# Patient Record
Sex: Male | Born: 1965 | Race: White | Hispanic: No | Marital: Married | Smoking: Current every day smoker
Health system: Southern US, Community
[De-identification: ages and names within clinical notes are randomized; demographics above are authoritative.]

## PROBLEM LIST (undated history)

## (undated) DIAGNOSIS — K852 Alcohol induced acute pancreatitis without necrosis or infection: Secondary | ICD-10-CM

## (undated) DIAGNOSIS — J449 Chronic obstructive pulmonary disease, unspecified: Secondary | ICD-10-CM

## (undated) DIAGNOSIS — J40 Bronchitis, not specified as acute or chronic: Secondary | ICD-10-CM

## (undated) DIAGNOSIS — I2699 Other pulmonary embolism without acute cor pulmonale: Secondary | ICD-10-CM

## (undated) DIAGNOSIS — K760 Fatty (change of) liver, not elsewhere classified: Secondary | ICD-10-CM

## (undated) DIAGNOSIS — J189 Pneumonia, unspecified organism: Secondary | ICD-10-CM

## (undated) DIAGNOSIS — R0602 Shortness of breath: Secondary | ICD-10-CM

## (undated) HISTORY — PX: CHOLECYSTECTOMY: SHX55

---
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
## 2002-03-02 HISTORY — PX: ORIF MANDIBULAR FRACTURE: SHX2127

## 2002-08-21 ENCOUNTER — Encounter: Payer: Self-pay | Admitting: Emergency Medicine

## 2002-08-21 ENCOUNTER — Encounter: Payer: Self-pay | Admitting: Oral and Maxillofacial Surgery

## 2002-08-22 ENCOUNTER — Observation Stay (HOSPITAL_COMMUNITY): Admission: AD | Admit: 2002-08-22 | Discharge: 2002-08-22 | Payer: Self-pay | Admitting: Emergency Medicine

## 2007-11-18 ENCOUNTER — Inpatient Hospital Stay (HOSPITAL_COMMUNITY): Admission: EM | Admit: 2007-11-18 | Discharge: 2007-11-23 | Payer: Self-pay | Admitting: Emergency Medicine
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
## 2010-07-15 NOTE — Group Therapy Note (Signed)
Stanley Thompson, Stanley Thompson            ACCOUNT NO.:  [id number]   MEDICAL RECORD NO.:  [id number]          PATIENT TYPE:  OBV   LOCATION:  A330                          FACILITY:  [facility]   PHYSICIAN:  Edward L. Juanetta Gosling, M.D.DATE OF BIRTH:  September 20, 1965   DATE OF PROCEDURE:  DATE OF DISCHARGE:                                 PROGRESS NOTE   SUBJECTIVE:  Stanley Thompson seems a good bit better.  He has no new  complaints and no new problems noted.  He says he is still coughing and  congested.  His temperature is 98.3, pulse 97, respirations 20, blood  pressure 128/74.  His blood gas on 2 liters shows his pO2 is only 52,  pCO2 of 42, pH 7.40.  His white count is 14,000, hemoglobin is 13.5,  blood sugar is 149.  He is coughing up some purulent material but no  material that has black in it, so I do not think he is having any  thermal injury to the actual airway, but he had certainly set off his  asthma.   PLAN:  Is for him to continue on his treatments and medications.  No  changes today.  We will see how he does.      Edward L. Juanetta Gosling, M.D.  Electronically Signed     ELH/MEDQ  D:  11/17/2007  T:  11/17/2007  Job:  [id number]
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
## 2010-07-15 NOTE — Group Therapy Note (Signed)
NAMEWALTER, GRIMA            ACCOUNT NO.:  [id number]   MEDICAL RECORD NO.:  [id number]          PATIENT TYPE:  INP   LOCATION:  A330                          [facility]ACILITY:  [facility]   PHYSICIAN:  Edward L. Juanetta Gosling, M.D.DATE OF BIRTH:  1966-02-25   DATE OF PROCEDURE:  11/20/2007  DATE OF DISCHARGE:                                 PROGRESS NOTE   Mr. Maahs is improving, seems to be overall better.  He has no new  complaints.  His chest is clearing, but he still has wheezes.  His heart  is regular.  His abdomen is soft.  Extremities showed no edema.   Assessment is that he is improving.  He has orders to change his  medications to p.o. which I think is appropriate and I will plan to  follow.      Edward L. Juanetta Gosling, M.D.  Electronically Signed     ELH/MEDQ  D:  11/20/2007  T:  11/21/2007  Job:  [id number]

## 2010-07-15 NOTE — Group Therapy Note (Signed)
Stanley Thompson, Stanley Thompson            ACCOUNT NO.:  [id number]   MEDICAL RECORD NO.:  [id number]          PATIENT TYPE:  INP   LOCATION:  A330                          [facility]ILITY[facility]   PHYSICIAN:  Edward L. Juanetta Gosling, M.D.DATE OF BIRTH:  03/24/1965   DATE OF PROCEDURE:  DATE OF DISCHARGE:                                 PROGRESS NOTE   Mr. Burtch states he was feeling much better yesterday, but this  morning he is more wheezing, more congested.  He is having some left-  sided chest discomfort which is pleuritic.  His physical exam shows that  he does not look as comfortable.  His temperature 98.2, pulse is 83,  respirations 20, blood pressure 154/81, O2 sats 94% on 3 liters.  His  heart is regular.  CHEST:  Much more wheezing, etc.   ASSESSMENT:  I think, he is a little worse.  We did switch him to oral  medications yesterday and my plan then is to get a chest x-ray and then  decide whether to put him back on IV or not.      Edward L. Juanetta Gosling, M.D.  Electronically Signed     ELH/MEDQ  D:  11/21/2007  T:  11/21/2007  Job:  [id number]
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
## 2010-11-06 ENCOUNTER — Emergency Department (HOSPITAL_COMMUNITY)
Admission: EM | Admit: 2010-11-06 | Discharge: 2010-11-06 | Disposition: A | Discharge status: Home / Self Care | Payer: Self-pay | Attending: Emergency Medicine | Admitting: Emergency Medicine

## 2010-11-06 DIAGNOSIS — R1110 Vomiting, unspecified: Secondary | ICD-10-CM | POA: Insufficient documentation

## 2010-11-06 DIAGNOSIS — R112 Nausea with vomiting, unspecified: Secondary | ICD-10-CM

## 2010-11-06 DIAGNOSIS — J45909 Unspecified asthma, uncomplicated: Secondary | ICD-10-CM | POA: Insufficient documentation

## 2010-11-06 DIAGNOSIS — K2990 Gastroduodenitis, unspecified, without bleeding: Secondary | ICD-10-CM

## 2010-11-06 DIAGNOSIS — F17200 Nicotine dependence, unspecified, uncomplicated: Secondary | ICD-10-CM | POA: Insufficient documentation

## 2010-11-06 DIAGNOSIS — K2970 Gastritis, unspecified, without bleeding: Secondary | ICD-10-CM

## 2010-11-06 HISTORY — DX: Pneumonia, unspecified organism: J18.9

## 2010-11-06 LAB — HEPATIC FUNCTION PANEL
Alkaline Phosphatase: 80 U/L (ref 39–117)
Bilirubin, Direct: 0.2 mg/dL (ref 0.0–0.3)
Indirect Bilirubin: 0.8 mg/dL (ref 0.3–0.9)
Total Bilirubin: 1 mg/dL (ref 0.3–1.2)

## 2010-11-06 LAB — URINE MICROSCOPIC-ADD ON

## 2010-11-06 LAB — BASIC METABOLIC PANEL
CO2: 30 mEq/L (ref 19–32)
Calcium: 9.2 mg/dL (ref 8.4–10.5)
Creatinine, Ser: 0.75 mg/dL (ref 0.50–1.35)
Glucose, Bld: 92 mg/dL (ref 70–99)

## 2010-11-06 LAB — CBC
HCT: 46.9 % (ref 39.0–52.0)
MCHC: 33.3 g/dL (ref 30.0–36.0)
MCV: 95.9 fL (ref 78.0–100.0)
RDW: 14.4 % (ref 11.5–15.5)
WBC: 7.3 10*3/uL (ref 4.0–10.5)

## 2010-11-06 LAB — DIFFERENTIAL
Basophils Absolute: 0 10*3/uL (ref 0.0–0.1)
Eosinophils Relative: 2 % (ref 0–5)
Lymphocytes Relative: 23 % (ref 12–46)
Monocytes Absolute: 0.5 10*3/uL (ref 0.1–1.0)

## 2010-11-06 LAB — URINALYSIS, ROUTINE W REFLEX MICROSCOPIC
Glucose, UA: NEGATIVE mg/dL
Hgb urine dipstick: NEGATIVE
Leukocytes, UA: NEGATIVE
Nitrite: NEGATIVE
Urobilinogen, UA: 1 mg/dL (ref 0.0–1.0)

## 2010-11-06 MED ORDER — SODIUM CHLORIDE 0.9 % IV BOLUS (SEPSIS)
1000.0000 mL | Freq: Once | INTRAVENOUS | Status: AC
  Administered 2010-11-06: 1000 mL via INTRAVENOUS

## 2010-11-06 MED ORDER — ONDANSETRON HCL 4 MG/2ML IJ SOLN
4.0000 mg | Freq: Once | INTRAMUSCULAR | Status: AC
  Administered 2010-11-06: 4 mg via INTRAVENOUS
  Filled 2010-11-06: qty 2

## 2010-11-06 MED ORDER — METOCLOPRAMIDE HCL 10 MG PO TABS: 10.0000 mg | ORAL_TABLET | Freq: Four times a day (QID) | ORAL | Status: AC | PRN

## 2010-11-06 MED ORDER — SODIUM CHLORIDE 0.9 % IV SOLN
Freq: Once | INTRAVENOUS | Status: AC
  Administered 2010-11-06: 14:00:00 via INTRAVENOUS

## 2010-11-06 NOTE — ED Provider Notes (Signed)
History     CSN: [id number] Arrival date & time: 11/06/2010 10:55 AM  Chief Complaint  Patient presents with  . Emesis   Patient is a 45 y.o. male presenting with vomiting. The history is provided by the patient.  Emesis  This is a new problem. The current episode started 2 days ago. The problem occurs 5 to 10 times per day. The problem has not changed since onset.The emesis has an appearance of stomach contents. There has been no fever. Pertinent negatives include no chills, no diarrhea, no fever and no sweats. Associated symptoms comments: There is abdominal soreness related to vomiting, but no true abdominal pain..  Symptoms are severe - he has not been able to hold anything down for two days. He tried taking Pepto Bismol with no relief. Nothing makes symptoms better, nothing makes it worse.  Past Medical History  Diagnosis Date  . Asthma   . Pneumonia     History reviewed. No pertinent past surgical history.  No family history on file.  History  Substance Use Topics  . Smoking status: Current Everyday Smoker  . Smokeless tobacco: Not on file  . Alcohol Use: Yes     occasionally      Review of Systems  Constitutional: Negative for fever and chills.  Gastrointestinal: Positive for vomiting. Negative for diarrhea.  All other systems reviewed and are negative.    Physical Exam  BP 153/88  Pulse 68  Temp(Src) 98.1 F (36.7 C) (Oral)  Resp 16  Ht 6\' 4"  (1.93 m)  Wt 270 lb (122.471 kg)  BMI 32.87 kg/m2  SpO2 96%  Physical Exam  Constitutional: He is oriented to person, place, and time. He appears well-developed and well-nourished. No distress.  HENT:  Head: Normocephalic and atraumatic.  Right Ear: External ear normal.  Left Ear: External ear normal.  Mouth/Throat: Oropharynx is clear and moist.  Eyes: Conjunctivae are normal. Pupils are equal, round, and reactive to light. No scleral icterus.  Neck: Normal range of motion. Neck supple. No JVD present.    Cardiovascular: Normal rate, regular rhythm and intact distal pulses.   No murmur heard. Pulmonary/Chest: Effort normal and breath sounds normal. He has no wheezes. He has no rales.  Abdominal: Soft. Bowel sounds are normal. He exhibits no mass. There is no tenderness.  Musculoskeletal: Normal range of motion. He exhibits no edema and no tenderness.  Lymphadenopathy:    He has no cervical adenopathy.  Neurological: He is alert and oriented to person, place, and time. No cranial nerve deficit. Coordination normal.  Skin: Skin is warm and dry. No rash noted.  Psychiatric: He has a normal mood and affect. His behavior is normal. Thought content normal.    ED Course  Procedures He was given IV fluids and IV Zofran. He is feeling much better now, and hass tolerated oral fluids. MDM Probable viral gastritis. Results for orders placed during the hospital encounter of 11/06/10  CBC      Component Value Range   WBC 7.3  4.0 - 10.5 (K/uL)   RBC 4.89  4.22 - 5.81 (MIL/uL)   Hemoglobin 15.6  13.0 - 17.0 (g/dL)   HCT 40.9  81.1 - 91.4 (%)   MCV 95.9  78.0 - 100.0 (fL)   MCH 31.9  26.0 - 34.0 (pg)   MCHC 33.3  30.0 - 36.0 (g/dL)   RDW 78.2  95.6 - 21.3 (%)   Platelets 147 (*) 150 - 400 (K/uL)  DIFFERENTIAL  Component Value Range   Neutrophils Relative 69  43 - 77 (%)   Neutro Abs 5.1  1.7 - 7.7 (K/uL)   Lymphocytes Relative 23  12 - 46 (%)   Lymphs Abs 1.7  0.7 - 4.0 (K/uL)   Monocytes Relative 7  3 - 12 (%)   Monocytes Absolute 0.5  0.1 - 1.0 (K/uL)   Eosinophils Relative 2  0 - 5 (%)   Eosinophils Absolute 0.1  0.0 - 0.7 (K/uL)   Basophils Relative 0  0 - 1 (%)   Basophils Absolute 0.0  0.0 - 0.1 (K/uL)  BASIC METABOLIC PANEL      Component Value Range   Sodium 139  135 - 145 (mEq/L)   Potassium 3.5  3.5 - 5.1 (mEq/L)   Chloride 95 (*) 96 - 112 (mEq/L)   CO2 30  19 - 32 (mEq/L)   Glucose, Bld 92  70 - 99 (mg/dL)   BUN 14  6 - 23 (mg/dL)   Creatinine, Ser 9.62  0.50 - 1.35  (mg/dL)   Calcium 9.2  8.4 - 95.2 (mg/dL)   GFR calc non Af Amer >60  >60 (mL/min)   GFR calc Af Amer >60  >60 (mL/min)  URINALYSIS, ROUTINE W REFLEX MICROSCOPIC      Component Value Range   Color, Urine YELLOW  YELLOW    Appearance CLEAR  CLEAR    Specific Gravity, Urine 1.020  1.005 - 1.030    pH 7.5  5.0 - 8.0    Glucose, UA NEGATIVE  NEGATIVE (mg/dL)   Hgb urine dipstick NEGATIVE  NEGATIVE    Bilirubin Urine MODERATE (*) NEGATIVE    Ketones, ur >80 (*) NEGATIVE (mg/dL)   Protein, ur TRACE (*) NEGATIVE (mg/dL)   Urobilinogen, UA 1.0  0.0 - 1.0 (mg/dL)   Nitrite NEGATIVE  NEGATIVE    Leukocytes, UA NEGATIVE  NEGATIVE   URINE MICROSCOPIC-ADD ON      Component Value Range   WBC, UA 0-2  <3 (WBC/hpf)   RBC / HPF 0-2  <3 (RBC/hpf)   Urine-Other MUCOUS PRESENT    LIPASE, BLOOD      Component Value Range   Lipase 54  11 - 59 (U/L)  HEPATIC FUNCTION PANEL      Component Value Range   Total Protein 6.8  6.0 - 8.3 (g/dL)   Albumin 4.0  3.5 - 5.2 (g/dL)   AST 41 (*) 0 - 37 (U/L)   ALT 33  0 - 53 (U/L)   Alkaline Phosphatase 80  39 - 117 (U/L)   Total Bilirubin 1.0  0.3 - 1.2 (mg/dL)   Bilirubin, Direct 0.2  0.0 - 0.3 (mg/dL)   Indirect Bilirubin 0.8  0.3 - 0.9 (mg/dL)   No results found.        Dione Booze, MD 11/06/10 1505

## 2010-11-06 NOTE — ED Notes (Signed)
Ice chips provided to pt.

## 2010-11-06 NOTE — ED Notes (Signed)
Pt medicated for nausea. NAD. VSS.

## 2010-11-06 NOTE — ED Notes (Signed)
Pt given wet swab for mouth. NAD at this time.

## 2010-11-06 NOTE — ED Notes (Signed)
Pt c/o vomiting, headache, and fever since Tuesday.  Denies diarrhea.  LBM  Was this am but was small.  C/O upper abd pain

## 2010-11-06 NOTE — ED Notes (Signed)
Pt has been able to keep ice chips down. NAD. No active vomiting since arrival. Family at bedside.

## 2010-12-01 LAB — DIFFERENTIAL
Basophils Absolute: 0
Basophils Absolute: 0
Basophils Absolute: 0
Basophils Absolute: 0
Basophils Absolute: 0
Basophils Relative: 0
Basophils Relative: 0
Basophils Relative: 0
Basophils Relative: 0
Eosinophils Absolute: 0
Eosinophils Absolute: 0
Eosinophils Absolute: 0
Eosinophils Absolute: 0
Eosinophils Absolute: 0
Eosinophils Relative: 0
Eosinophils Relative: 0
Eosinophils Relative: 0
Eosinophils Relative: 0
Eosinophils Relative: 0
Lymphocytes Relative: 12
Lymphocytes Relative: 7 — ABNORMAL LOW
Lymphocytes Relative: 7 — ABNORMAL LOW
Lymphs Abs: 0.6 — ABNORMAL LOW
Lymphs Abs: 0.6 — ABNORMAL LOW
Lymphs Abs: 0.9
Lymphs Abs: 0.9
Monocytes Absolute: 0.2
Monocytes Absolute: 0.2
Monocytes Absolute: 0.3
Monocytes Relative: 1 — ABNORMAL LOW
Monocytes Relative: 2 — ABNORMAL LOW
Monocytes Relative: 2 — ABNORMAL LOW
Monocytes Relative: 6
Neutro Abs: 10.6 — ABNORMAL HIGH
Neutro Abs: 11.3 — ABNORMAL HIGH
Neutro Abs: 7.1
Neutrophils Relative %: 85 — ABNORMAL HIGH
Neutrophils Relative %: 89 — ABNORMAL HIGH
Neutrophils Relative %: 91 — ABNORMAL HIGH
Neutrophils Relative %: 92 — ABNORMAL HIGH

## 2010-12-01 LAB — BASIC METABOLIC PANEL
BUN: 12
BUN: 14
BUN: 16
CO2: 24
CO2: 30
CO2: 30
Calcium: 8.3 — ABNORMAL LOW
Calcium: 8.8
Calcium: 8.9
Chloride: 106
Creatinine, Ser: 0.85
Creatinine, Ser: 0.86
Creatinine, Ser: 0.88
Creatinine, Ser: 0.97
GFR calc Af Amer: >60
GFR calc Af Amer: >60
GFR calc Af Amer: >60
GFR calc Af Amer: >60
GFR calc non Af Amer: >60
GFR calc non Af Amer: >60
GFR calc non Af Amer: >60
Glucose, Bld: 127 — ABNORMAL HIGH
Glucose, Bld: 156 — ABNORMAL HIGH
Potassium: 3.6
Potassium: 4.5
Sodium: 136
Sodium: 142

## 2010-12-01 LAB — CBC
HCT: 39.7
HCT: 42.2
HCT: 42.5
HCT: 42.9
Hemoglobin: 14.1
Hemoglobin: 14.3
Hemoglobin: 14.4
Hemoglobin: 15.3
MCHC: 34.1
MCHC: 34.3
MCHC: 34.5
MCV: 89.1
MCV: 89.6
MCV: 89.7
MCV: 89.8
Platelets: 150
Platelets: 168
Platelets: 208
Platelets: 225
RBC: 4.4
RBC: 4.55
RBC: 4.72
RBC: 4.76
RBC: 5.01
RDW: 12.6
RDW: 12.8
WBC: 10.6 — ABNORMAL HIGH
WBC: 13.2 — ABNORMAL HIGH
WBC: 14.6 — ABNORMAL HIGH
WBC: 7.3
WBC: 9.4

## 2010-12-01 LAB — BLOOD GAS, ARTERIAL
Acid-Base Excess: 0.7
Bicarbonate: 26.2 — ABNORMAL HIGH
Bicarbonate: 27.3 — ABNORMAL HIGH
FIO2: 0.21
O2 Content: 3
O2 Saturation: 85
O2 Saturation: 87.7
O2 Saturation: 93.3
Patient temperature: 37
Patient temperature: 37
Patient temperature: 37
TCO2: 23.3
pH, Arterial: 7.409
pH, Arterial: 7.454 — ABNORMAL HIGH

## 2010-12-01 LAB — COMPREHENSIVE METABOLIC PANEL
ALT: 13
AST: 16
CO2: 27
Chloride: 108
Creatinine, Ser: 0.85
GFR calc Af Amer: >60
GFR calc non Af Amer: >60
Glucose, Bld: 170 — ABNORMAL HIGH
Sodium: 142
Total Bilirubin: 0.5

## 2011-04-01 ENCOUNTER — Emergency Department (HOSPITAL_COMMUNITY)
Admission: EM | Admit: 2011-04-01 | Discharge: 2011-04-01 | Disposition: A | Discharge status: Home / Self Care | Payer: Self-pay | Attending: Emergency Medicine | Admitting: Emergency Medicine

## 2011-04-01 ENCOUNTER — Encounter (HOSPITAL_COMMUNITY): Payer: Self-pay | Admitting: *Deleted

## 2011-04-01 DIAGNOSIS — Z8701 Personal history of pneumonia (recurrent): Secondary | ICD-10-CM | POA: Insufficient documentation

## 2011-04-01 DIAGNOSIS — J45909 Unspecified asthma, uncomplicated: Secondary | ICD-10-CM | POA: Insufficient documentation

## 2011-04-01 DIAGNOSIS — R220 Localized swelling, mass and lump, head: Secondary | ICD-10-CM | POA: Insufficient documentation

## 2011-04-01 DIAGNOSIS — R609 Edema, unspecified: Secondary | ICD-10-CM | POA: Insufficient documentation

## 2011-04-01 DIAGNOSIS — R252 Cramp and spasm: Secondary | ICD-10-CM | POA: Insufficient documentation

## 2011-04-01 DIAGNOSIS — R112 Nausea with vomiting, unspecified: Secondary | ICD-10-CM | POA: Insufficient documentation

## 2011-04-01 DIAGNOSIS — J3489 Other specified disorders of nose and nasal sinuses: Secondary | ICD-10-CM | POA: Insufficient documentation

## 2011-04-01 DIAGNOSIS — Z7982 Long term (current) use of aspirin: Secondary | ICD-10-CM | POA: Insufficient documentation

## 2011-04-01 DIAGNOSIS — K529 Noninfective gastroenteritis and colitis, unspecified: Secondary | ICD-10-CM

## 2011-04-01 DIAGNOSIS — R221 Localized swelling, mass and lump, neck: Secondary | ICD-10-CM | POA: Insufficient documentation

## 2011-04-01 DIAGNOSIS — R197 Diarrhea, unspecified: Secondary | ICD-10-CM | POA: Insufficient documentation

## 2011-04-01 HISTORY — DX: Bronchitis, not specified as acute or chronic: J40

## 2011-04-01 LAB — CBC
HCT: 39.6 % (ref 39.0–52.0)
MCV: 98.3 fL (ref 78.0–100.0)
Platelets: 126 10*3/uL — ABNORMAL LOW (ref 150–400)
RBC: 4.03 MIL/uL — ABNORMAL LOW (ref 4.22–5.81)
RDW: 14.8 % (ref 11.5–15.5)
WBC: 4.1 10*3/uL (ref 4.0–10.5)

## 2011-04-01 LAB — DIFFERENTIAL
Basophils Absolute: 0 10*3/uL (ref 0.0–0.1)
Lymphocytes Relative: 31 % (ref 12–46)
Lymphs Abs: 1.3 10*3/uL (ref 0.7–4.0)
Neutro Abs: 2.3 10*3/uL (ref 1.7–7.7)

## 2011-04-01 LAB — URINALYSIS, ROUTINE W REFLEX MICROSCOPIC
Glucose, UA: NEGATIVE mg/dL
Hgb urine dipstick: NEGATIVE
Leukocytes, UA: NEGATIVE
Specific Gravity, Urine: 1.01 (ref 1.005–1.030)
pH: >9 — ABNORMAL HIGH (ref 5.0–8.0)

## 2011-04-01 LAB — BASIC METABOLIC PANEL
CO2: 28 mEq/L (ref 19–32)
Chloride: 95 mEq/L — ABNORMAL LOW (ref 96–112)
Glucose, Bld: 103 mg/dL — ABNORMAL HIGH (ref 70–99)
Sodium: 135 mEq/L (ref 135–145)

## 2011-04-01 LAB — URINE MICROSCOPIC-ADD ON

## 2011-04-01 MED ORDER — ONDANSETRON HCL 8 MG PO TABS: 8.0000 mg | ORAL_TABLET | ORAL | Status: AC | PRN

## 2011-04-01 MED ORDER — AMOXICILLIN-POT CLAVULANATE 875-125 MG PO TABS: 1.0000 | ORAL_TABLET | Freq: Two times a day (BID) | ORAL | Status: AC

## 2011-04-01 MED ORDER — SODIUM CHLORIDE 0.9 % IV BOLUS (SEPSIS)
1000.0000 mL | Freq: Once | INTRAVENOUS | Status: AC
  Administered 2011-04-01: 1000 mL via INTRAVENOUS

## 2011-04-01 MED ORDER — ONDANSETRON HCL 4 MG/2ML IJ SOLN
4.0000 mg | Freq: Once | INTRAMUSCULAR | Status: AC
  Administered 2011-04-01: 4 mg via INTRAVENOUS
  Filled 2011-04-01: qty 2

## 2011-04-01 MED ORDER — DIPHENOXYLATE-ATROPINE 2.5-0.025 MG PO TABS: 1.0000 | ORAL_TABLET | Freq: Four times a day (QID) | ORAL | Status: AC | PRN

## 2011-04-01 MED ORDER — PANTOPRAZOLE SODIUM 40 MG IV SOLR
40.0000 mg | Freq: Once | INTRAVENOUS | Status: AC
  Administered 2011-04-01: 40 mg via INTRAVENOUS
  Filled 2011-04-01: qty 40

## 2011-04-01 NOTE — ED Notes (Signed)
PT c/o n/v/d x 3 days.  Reports swelling under eyes and cramping in both legs.  Denies abd pain but says abd feels sore from vomiting.

## 2011-04-01 NOTE — ED Provider Notes (Signed)
History   This chart was scribed for Stanley Hutching, MD by Melba Coon. The patient was seen in room APA07/APA07 and the patient's care was started at 3:28PM.    CSN: [id number]  Arrival date & time 04/01/11  1415   First MD Initiated Contact with Patient 04/01/11 1509      Chief Complaint  Patient presents with  . Emesis    (Consider location/radiation/quality/duration/timing/severity/associated sxs/prior treatment) HPI Stanley Thompson is a 46 y.o. male who presents to the Emergency Department complaining of persistent, moderate to severe n/v/d with an onset 3 days ago. Pt describes the diarrhea as black and watery and states the vomit has a yellow color. Pt has also been experiencing nasal congestion, edema around the eyes and upper cheeks, rhinorrhea, cramps and aches in calves. Pt has a Hx of bronchitis and asthma. No CP, no neck pain, no abd pain.  PCP: none      Past Medical History  Diagnosis Date  . Asthma   . Pneumonia   . Bronchitis     Past Surgical History  Procedure Date  . Back surgery     Family History  Problem Relation Age of Onset  . Hypertension Mother   . Cancer Mother   . Hypertension Father   . Heart failure Father   . Cancer Father     History  Substance Use Topics  . Smoking status: Current Everyday Smoker  . Smokeless tobacco: Not on file  . Alcohol Use: Yes     occasionally      Review of Systems 10 Systems reviewed and are negative for acute change except as noted in the HPI.  Allergies  Advair diskus  Home Medications   Current Outpatient Rx  Name Route Sig Dispense Refill  . PROVENTIL HFA IN Inhalation Inhale 1 puff into the lungs every 4 (four) hours as needed. Shortness of Breath/Asthma Symptoms.    . ASPIRIN 81 MG PO CHEW Oral Chew 81 mg by mouth every 7 (seven) days.    Marland Kitchen ADVIL PM PO Oral Take 1 tablet by mouth at bedtime as needed. Sleep     . NAPROXEN SODIUM 220 MG PO TABS Oral Take 220 mg by mouth 2 (two)  times daily as needed. Headache     . TETRAHYDROZOLINE-ZN SULFATE 0.05-0.25 % OP SOLN Both Eyes Place 2 drops into both eyes 3 (three) times daily as needed. Allergies     . MELATIN PO Oral Take 1 tablet by mouth at bedtime as needed. Sleep       BP 154/97  Pulse 70  Temp(Src) 99.1 F (37.3 C) (Oral)  Resp 20  Ht 6\' 3"  (1.905 m)  Wt 255 lb (115.667 kg)  BMI 31.87 kg/m2  SpO2 92%  Physical Exam  Constitutional: He is oriented to person, place, and time. He appears well-developed and well-nourished.  HENT:  Head: Normocephalic and atraumatic.  Mouth/Throat: Mucous membranes are dry.       Maxillary sinus swelling   Eyes: Conjunctivae and EOM are normal. Pupils are equal, round, and reactive to light. No scleral icterus.  Neck: Normal range of motion. Neck supple. No thyromegaly present.  Cardiovascular: Normal rate, regular rhythm and normal heart sounds.  Exam reveals no gallop and no friction rub.   No murmur heard. Pulmonary/Chest: Effort normal and breath sounds normal. No stridor. He has no wheezes. He has no rales. He exhibits no tenderness.  Abdominal: Soft. Bowel sounds are normal. He exhibits no distension. There is  no tenderness. There is no rebound.  Musculoskeletal: Normal range of motion. He exhibits no edema.  Lymphadenopathy:    He has no cervical adenopathy.  Neurological: He is alert and oriented to person, place, and time. Coordination normal.  Skin: Skin is warm. No rash noted. No erythema.  Psychiatric: He has a normal mood and affect. His behavior is normal.    ED Course  Procedures (including critical care time)  DIAGNOSTIC STUDIES: Oxygen Saturation is 96% on room air, adequate by my interpretation.    COORDINATION OF CARE: 3:33PM - EDMD will give to pt IV fluids with Zofran and protonix to settle stomach; EDMD will also run blood work for pt   Results for orders placed during the hospital encounter of 04/01/11  CBC      Component Value Range    WBC 4.1  4.0 - 10.5 (K/uL)   RBC 4.03 (*) 4.22 - 5.81 (MIL/uL)   Hemoglobin 13.3  13.0 - 17.0 (g/dL)   HCT 16.1  09.6 - 04.5 (%)   MCV 98.3  78.0 - 100.0 (fL)   MCH 33.0  26.0 - 34.0 (pg)   MCHC 33.6  30.0 - 36.0 (g/dL)   RDW 40.9  81.1 - 91.4 (%)   Platelets 126 (*) 150 - 400 (K/uL)  DIFFERENTIAL      Component Value Range   Neutrophils Relative 55  43 - 77 (%)   Neutro Abs 2.3  1.7 - 7.7 (K/uL)   Lymphocytes Relative 31  12 - 46 (%)   Lymphs Abs 1.3  0.7 - 4.0 (K/uL)   Monocytes Relative 11  3 - 12 (%)   Monocytes Absolute 0.5  0.1 - 1.0 (K/uL)   Eosinophils Relative 2  0 - 5 (%)   Eosinophils Absolute 0.1  0.0 - 0.7 (K/uL)   Basophils Relative 1  0 - 1 (%)   Basophils Absolute 0.0  0.0 - 0.1 (K/uL)  BASIC METABOLIC PANEL      Component Value Range   Sodium 135  135 - 145 (mEq/L)   Potassium 3.7  3.5 - 5.1 (mEq/L)   Chloride 95 (*) 96 - 112 (mEq/L)   CO2 28  19 - 32 (mEq/L)   Glucose, Bld 103 (*) 70 - 99 (mg/dL)   BUN 10  6 - 23 (mg/dL)   Creatinine, Ser 7.82  0.50 - 1.35 (mg/dL)   Calcium 9.5  8.4 - 95.6 (mg/dL)   GFR calc non Af Amer >90  >90 (mL/min)   GFR calc Af Amer >90  >90 (mL/min)  URINALYSIS, ROUTINE W REFLEX MICROSCOPIC      Component Value Range   Color, Urine YELLOW  YELLOW    APPearance CLEAR  CLEAR    Specific Gravity, Urine 1.010  1.005 - 1.030    pH >9.0 (*) 5.0 - 8.0    Glucose, UA NEGATIVE  NEGATIVE (mg/dL)   Hgb urine dipstick NEGATIVE  NEGATIVE    Bilirubin Urine SMALL (*) NEGATIVE    Ketones, ur 40 (*) NEGATIVE (mg/dL)   Protein, ur 30 (*) NEGATIVE (mg/dL)   Urobilinogen, UA 1.0  0.0 - 1.0 (mg/dL)   Nitrite NEGATIVE  NEGATIVE    Leukocytes, UA NEGATIVE  NEGATIVE   URINE MICROSCOPIC-ADD ON      Component Value Range   Squamous Epithelial / LPF RARE  RARE    Bacteria, UA RARE  RARE    Casts HYALINE CASTS (*) NEGATIVE      No results found.  No diagnosis found.    MDM  History and physical consistent with gastroenteritis. Will  hydrate, IV Zofran, IV protonix Recheck at 1830:  Much better. Discharge home on Lomotil, Zofran, Augmentin for sinus infection  I personally performed the services described in this documentation, which was scribed in my presence. The recorded information has been reviewed and considered.        Stanley Hutching, MD 04/01/11 [phone]

## 2011-04-01 NOTE — ED Notes (Signed)
Vomiting , diarrhea, chills for 3 days, fever.sweats. Eyes feel "puffy"  Cough

## 2011-11-20 ENCOUNTER — Emergency Department (HOSPITAL_COMMUNITY): Payer: Self-pay

## 2011-11-20 ENCOUNTER — Encounter (HOSPITAL_COMMUNITY): Payer: Self-pay | Admitting: *Deleted

## 2011-11-20 ENCOUNTER — Emergency Department (HOSPITAL_COMMUNITY)
Admission: EM | Admit: 2011-11-20 | Discharge: 2011-11-20 | Disposition: A | Discharge status: Home / Self Care | Payer: Self-pay | Attending: Emergency Medicine | Admitting: Emergency Medicine

## 2011-11-20 DIAGNOSIS — Z7982 Long term (current) use of aspirin: Secondary | ICD-10-CM | POA: Insufficient documentation

## 2011-11-20 DIAGNOSIS — J9801 Acute bronchospasm: Secondary | ICD-10-CM

## 2011-11-20 DIAGNOSIS — J45909 Unspecified asthma, uncomplicated: Secondary | ICD-10-CM | POA: Insufficient documentation

## 2011-11-20 DIAGNOSIS — R51 Headache: Secondary | ICD-10-CM | POA: Insufficient documentation

## 2011-11-20 MED ORDER — SODIUM CHLORIDE 0.9 % IV SOLN
Freq: Once | INTRAVENOUS | Status: AC
  Administered 2011-11-20: 20 mL/h via INTRAVENOUS

## 2011-11-20 MED ORDER — PREDNISONE 20 MG PO TABS
60.0000 mg | ORAL_TABLET | Freq: Once | ORAL | Status: AC
  Administered 2011-11-20: 60 mg via ORAL
  Filled 2011-11-20: qty 3

## 2011-11-20 MED ORDER — TRAMADOL HCL 50 MG PO TABS: 50.0000 mg | ORAL_TABLET | Freq: Four times a day (QID) | ORAL | Status: DC | PRN

## 2011-11-20 MED ORDER — IPRATROPIUM BROMIDE 0.02 % IN SOLN
0.5000 mg | Freq: Once | RESPIRATORY_TRACT | Status: AC
  Administered 2011-11-20: 0.5 mg via RESPIRATORY_TRACT
  Filled 2011-11-20: qty 2.5

## 2011-11-20 MED ORDER — ALBUTEROL SULFATE (5 MG/ML) 0.5% IN NEBU
5.0000 mg | INHALATION_SOLUTION | Freq: Once | RESPIRATORY_TRACT | Status: AC
  Administered 2011-11-20: 5 mg via RESPIRATORY_TRACT
  Filled 2011-11-20: qty 1

## 2011-11-20 MED ORDER — METOCLOPRAMIDE HCL 5 MG/ML IJ SOLN
10.0000 mg | Freq: Once | INTRAMUSCULAR | Status: AC
  Administered 2011-11-20: 10 mg via INTRAVENOUS
  Filled 2011-11-20: qty 2

## 2011-11-20 MED ORDER — DIPHENHYDRAMINE HCL 12.5 MG/5ML PO ELIX
25.0000 mg | ORAL_SOLUTION | Freq: Once | ORAL | Status: DC
  Filled 2011-11-20: qty 10

## 2011-11-20 MED ORDER — ALBUTEROL SULFATE HFA 108 (90 BASE) MCG/ACT IN AERS
2.0000 | INHALATION_SPRAY | RESPIRATORY_TRACT | Status: DC | PRN
  Filled 2011-11-20: qty 6.7

## 2011-11-20 MED ORDER — DIPHENHYDRAMINE HCL 50 MG/ML IJ SOLN
25.0000 mg | Freq: Once | INTRAMUSCULAR | Status: AC
  Administered 2011-11-20: 25 mg via INTRAVENOUS
  Filled 2011-11-20: qty 1

## 2011-11-20 MED ORDER — KETOROLAC TROMETHAMINE 30 MG/ML IJ SOLN
30.0000 mg | Freq: Once | INTRAMUSCULAR | Status: AC
  Administered 2011-11-20: 30 mg via INTRAVENOUS
  Filled 2011-11-20: qty 1

## 2011-11-20 NOTE — ED Notes (Signed)
Pt presents with left sided headache x 2 weeks. Pt states was in medical induced coma, while intubated at [facility] with pneumonia over labor day weekend, has had headache since.  Pt also c/o chest congestion and nasal drainage. X 1 week. Denies fever,N/V, vision changes and neck pain. Neurologically intact, no deficits noted.

## 2011-11-20 NOTE — ED Provider Notes (Signed)
History   This chart was scribed for Benny Lennert, MD by Gerlean Ren. This patient was seen in room APA05/APA05 and the patient's care was started at 1:27PM.   CSN: [id number]  Arrival date & time 11/20/11  1258   First MD Initiated Contact with Patient 11/20/11 1320      Chief Complaint  Patient presents with  . Headache    (Consider location/radiation/quality/duration/timing/severity/associated sxs/prior treatment) Patient is a 46 y.o. male presenting with headaches. The history is provided by the patient. No language interpreter was used.  Headache  This is a new problem.   Stanley Thompson is a 46 y.o. male who presents to the Emergency Department complaining of a headache that has been constant since D/C from hospital 9/14 where he was in ICU for 8 days for "double pneumonia".  Pt denies any h/o similar HA.  Pt denies any associated nausea.  Pt is a current everyday smoker and reports occasional alcohol use. Past Medical History  Diagnosis Date  . Asthma   . Pneumonia   . Bronchitis     Past Surgical History  Procedure Date  . Back surgery     Family History  Problem Relation Age of Onset  . Hypertension Mother   . Cancer Mother   . Hypertension Father   . Heart failure Father   . Cancer Father     History  Substance Use Topics  . Smoking status: Current Every Day Smoker  . Smokeless tobacco: Not on file  . Alcohol Use: Yes     occasionally      Review of Systems  Constitutional: Negative for fatigue.  HENT: Negative for congestion, sinus pressure and ear discharge.   Eyes: Negative for discharge.  Respiratory: Negative for cough.   Cardiovascular: Negative for chest pain.  Gastrointestinal: Negative for abdominal pain and diarrhea.  Genitourinary: Negative for frequency and hematuria.  Musculoskeletal: Negative for back pain.  Skin: Negative for rash.  Neurological: Positive for headaches. Negative for seizures.  Hematological: Negative.     Psychiatric/Behavioral: Negative for hallucinations.    Allergies  Advair diskus  Home Medications   Current Outpatient Rx  Name Route Sig Dispense Refill  . PROVENTIL HFA IN Inhalation Inhale 1 puff into the lungs every 4 (four) hours as needed. Shortness of Breath/Asthma Symptoms.    . ASPIRIN 81 MG PO CHEW Oral Chew 81 mg by mouth every 7 (seven) days.    Marland Kitchen ADVIL PM PO Oral Take 1 tablet by mouth at bedtime as needed. Sleep     . MELATIN PO Oral Take 1 tablet by mouth at bedtime as needed. Sleep     . NAPROXEN SODIUM 220 MG PO TABS Oral Take 220 mg by mouth 2 (two) times daily as needed. Headache     . TETRAHYDROZOLINE-ZN SULFATE 0.05-0.25 % OP SOLN Both Eyes Place 2 drops into both eyes 3 (three) times daily as needed. Allergies       BP 131/83  Pulse 92  Temp 98.9 F (37.2 C) (Oral)  Resp 20  Ht 6\' 4"  (1.93 m)  Wt 261 lb (118.389 kg)  BMI 31.77 kg/m2  SpO2 97%  Physical Exam  Nursing note and vitals reviewed. Constitutional: He is oriented to person, place, and time. He appears well-developed.  HENT:  Head: Normocephalic and atraumatic.  Eyes: Conjunctivae normal and EOM are normal. No scleral icterus.  Neck: Neck supple. No thyromegaly present.  Cardiovascular: Normal rate and regular rhythm.  Exam reveals no  gallop and no friction rub.   No murmur heard. Pulmonary/Chest: No stridor. He has wheezes (Moderate bilateral wheezes). He has no rales. He exhibits no tenderness.  Abdominal: He exhibits no distension. There is no tenderness. There is no rebound.  Musculoskeletal: Normal range of motion. He exhibits no edema.  Lymphadenopathy:    He has no cervical adenopathy.  Neurological: He is oriented to person, place, and time. Coordination normal.  Skin: No rash noted. No erythema.  Psychiatric: He has a normal mood and affect. His behavior is normal.    ED Course  Procedures (including critical care time) DIAGNOSTIC STUDIES: Oxygen Saturation is 97% on room  air, adequate by my interpretation.    COORDINATION OF CARE: 1:31PM- Discussed treatment plan with pt at bedside and pt agreed to plan.    Labs Reviewed - No data to display No results found.   No diagnosis found.    MDM  The chart was scribed for me under my direct supervision.  I personally performed the history, physical, and medical decision making and all procedures in the evaluation of this patient.Benny Lennert, MD 11/20/11 [phone]

## 2011-11-20 NOTE — ED Notes (Signed)
Pt was admitted to [facility] 8/31  With "double pneumonia" Stopped breathing  At home and assisted by wife,  Pt was in ICU for 8 days, intubated  And in regular room for 7-8 days. D/C 9/14   Cont to be congested but has come to ER due to headache which he had while he was hospitalized.  No nausea,

## 2012-08-19 ENCOUNTER — Emergency Department (HOSPITAL_COMMUNITY): Payer: Self-pay

## 2012-08-19 ENCOUNTER — Inpatient Hospital Stay (HOSPITAL_COMMUNITY)
Admission: EM | Admit: 2012-08-19 | Discharge: 2012-08-23 | DRG: 440 | Disposition: A | Discharge status: Home / Self Care | Payer: MEDICAID | Attending: Internal Medicine | Admitting: Internal Medicine

## 2012-08-19 ENCOUNTER — Encounter (HOSPITAL_COMMUNITY): Payer: Self-pay

## 2012-08-19 DIAGNOSIS — F17200 Nicotine dependence, unspecified, uncomplicated: Secondary | ICD-10-CM | POA: Diagnosis present

## 2012-08-19 DIAGNOSIS — E876 Hypokalemia: Secondary | ICD-10-CM | POA: Diagnosis present

## 2012-08-19 DIAGNOSIS — K8020 Calculus of gallbladder without cholecystitis without obstruction: Secondary | ICD-10-CM | POA: Diagnosis present

## 2012-08-19 DIAGNOSIS — E8809 Other disorders of plasma-protein metabolism, not elsewhere classified: Secondary | ICD-10-CM | POA: Diagnosis present

## 2012-08-19 DIAGNOSIS — F1010 Alcohol abuse, uncomplicated: Secondary | ICD-10-CM | POA: Diagnosis present

## 2012-08-19 DIAGNOSIS — K7689 Other specified diseases of liver: Secondary | ICD-10-CM | POA: Diagnosis present

## 2012-08-19 DIAGNOSIS — J4489 Other specified chronic obstructive pulmonary disease: Secondary | ICD-10-CM | POA: Diagnosis present

## 2012-08-19 DIAGNOSIS — I10 Essential (primary) hypertension: Secondary | ICD-10-CM | POA: Diagnosis present

## 2012-08-19 DIAGNOSIS — Z8701 Personal history of pneumonia (recurrent): Secondary | ICD-10-CM

## 2012-08-19 DIAGNOSIS — J449 Chronic obstructive pulmonary disease, unspecified: Secondary | ICD-10-CM

## 2012-08-19 DIAGNOSIS — F1020 Alcohol dependence, uncomplicated: Secondary | ICD-10-CM | POA: Diagnosis present

## 2012-08-19 DIAGNOSIS — Z23 Encounter for immunization: Secondary | ICD-10-CM

## 2012-08-19 DIAGNOSIS — R1115 Cyclical vomiting syndrome unrelated to migraine: Secondary | ICD-10-CM | POA: Diagnosis present

## 2012-08-19 DIAGNOSIS — Z8249 Family history of ischemic heart disease and other diseases of the circulatory system: Secondary | ICD-10-CM

## 2012-08-19 DIAGNOSIS — E669 Obesity, unspecified: Secondary | ICD-10-CM | POA: Diagnosis present

## 2012-08-19 DIAGNOSIS — K859 Acute pancreatitis without necrosis or infection, unspecified: Principal | ICD-10-CM | POA: Diagnosis present

## 2012-08-19 LAB — CBC WITH DIFFERENTIAL/PLATELET
Basophils Absolute: 0 10*3/uL (ref 0.0–0.1)
Eosinophils Relative: 0 % (ref 0–5)
Lymphocytes Relative: 19 % (ref 12–46)
Lymphs Abs: 1 10*3/uL (ref 0.7–4.0)
MCV: 106.3 fL — ABNORMAL HIGH (ref 78.0–100.0)
Neutro Abs: 4 10*3/uL (ref 1.7–7.7)
Platelets: 184 10*3/uL (ref 150–400)
RBC: 2.85 MIL/uL — ABNORMAL LOW (ref 4.22–5.81)
RDW: 14.2 % (ref 11.5–15.5)
WBC: 5.6 10*3/uL (ref 4.0–10.5)

## 2012-08-19 LAB — COMPREHENSIVE METABOLIC PANEL
AST: 170 U/L — ABNORMAL HIGH (ref 0–37)
Albumin: 2.5 g/dL — ABNORMAL LOW (ref 3.5–5.2)
BUN: 6 mg/dL (ref 6–23)
CO2: 28 mEq/L (ref 19–32)
Calcium: 8.3 mg/dL — ABNORMAL LOW (ref 8.4–10.5)
Creatinine, Ser: 0.52 mg/dL (ref 0.50–1.35)
GFR calc non Af Amer: >90 mL/min (ref >90)
Total Bilirubin: 1.3 mg/dL — ABNORMAL HIGH (ref 0.3–1.2)

## 2012-08-19 LAB — URINALYSIS, ROUTINE W REFLEX MICROSCOPIC
Ketones, ur: NEGATIVE mg/dL
Leukocytes, UA: NEGATIVE
Nitrite: NEGATIVE
Protein, ur: NEGATIVE mg/dL

## 2012-08-19 LAB — LIPASE, BLOOD: Lipase: 540 U/L — ABNORMAL HIGH (ref 11–59)

## 2012-08-19 LAB — PRO B NATRIURETIC PEPTIDE: Pro B Natriuretic peptide (BNP): 43.1 pg/mL (ref 0–125)

## 2012-08-19 LAB — MAGNESIUM: Magnesium: 1.1 mg/dL — ABNORMAL LOW (ref 1.5–2.5)

## 2012-08-19 LAB — TROPONIN I: Troponin I: <0.3 ng/mL (ref <0.30)

## 2012-08-19 MED ORDER — ALBUTEROL SULFATE (5 MG/ML) 0.5% IN NEBU
2.5000 mg | INHALATION_SOLUTION | Freq: Once | RESPIRATORY_TRACT | Status: AC
  Administered 2012-08-19: 2.5 mg via RESPIRATORY_TRACT
  Filled 2012-08-19: qty 0.5

## 2012-08-19 MED ORDER — HYDROMORPHONE HCL PF 1 MG/ML IJ SOLN
1.0000 mg | INTRAMUSCULAR | Status: DC | PRN
  Administered 2012-08-19 – 2012-08-22 (×12): 1 mg via INTRAVENOUS
  Filled 2012-08-19 (×12): qty 1

## 2012-08-19 MED ORDER — IOHEXOL 300 MG/ML  SOLN
100.0000 mL | Freq: Once | INTRAMUSCULAR | Status: AC | PRN
  Administered 2012-08-19: 100 mL via INTRAVENOUS

## 2012-08-19 MED ORDER — ACETAMINOPHEN 650 MG RE SUPP: 650.0000 mg | Freq: Four times a day (QID) | RECTAL | Status: DC | PRN

## 2012-08-19 MED ORDER — ONDANSETRON HCL 4 MG/2ML IJ SOLN
4.0000 mg | Freq: Once | INTRAMUSCULAR | Status: AC
  Administered 2012-08-19: 4 mg via INTRAVENOUS
  Filled 2012-08-19: qty 2

## 2012-08-19 MED ORDER — POTASSIUM CHLORIDE CRYS ER 20 MEQ PO TBCR
40.0000 meq | EXTENDED_RELEASE_TABLET | ORAL | Status: AC
  Administered 2012-08-19 – 2012-08-20 (×4): 40 meq via ORAL
  Filled 2012-08-19 (×4): qty 2

## 2012-08-19 MED ORDER — LORAZEPAM 2 MG/ML IJ SOLN
0.0000 mg | Freq: Four times a day (QID) | INTRAMUSCULAR | Status: AC
  Administered 2012-08-19 – 2012-08-21 (×5): 1 mg via INTRAVENOUS
  Filled 2012-08-19 (×6): qty 1

## 2012-08-19 MED ORDER — HYDROMORPHONE HCL PF 1 MG/ML IJ SOLN
1.0000 mg | Freq: Once | INTRAMUSCULAR | Status: AC
  Administered 2012-08-19: 1 mg via INTRAVENOUS
  Filled 2012-08-19: qty 1

## 2012-08-19 MED ORDER — SODIUM CHLORIDE 0.9 % IV SOLN
INTRAVENOUS | Status: DC
  Administered 2012-08-19: 14:00:00 via INTRAVENOUS

## 2012-08-19 MED ORDER — LORAZEPAM 1 MG PO TABS
1.0000 mg | ORAL_TABLET | Freq: Four times a day (QID) | ORAL | Status: AC | PRN
  Administered 2012-08-21: 1 mg via ORAL
  Filled 2012-08-19: qty 1

## 2012-08-19 MED ORDER — FOLIC ACID 1 MG PO TABS
1.0000 mg | ORAL_TABLET | Freq: Every day | ORAL | Status: DC
  Administered 2012-08-19 – 2012-08-23 (×5): 1 mg via ORAL
  Filled 2012-08-19 (×5): qty 1

## 2012-08-19 MED ORDER — LEVALBUTEROL HCL 0.63 MG/3ML IN NEBU: 0.6300 mg | INHALATION_SOLUTION | RESPIRATORY_TRACT | Status: DC | PRN

## 2012-08-19 MED ORDER — GUAIFENESIN ER 600 MG PO TB12
1200.0000 mg | ORAL_TABLET | Freq: Two times a day (BID) | ORAL | Status: DC
  Administered 2012-08-19 – 2012-08-23 (×8): 1200 mg via ORAL
  Filled 2012-08-19 (×8): qty 2

## 2012-08-19 MED ORDER — ADULT MULTIVITAMIN W/MINERALS CH
1.0000 | ORAL_TABLET | Freq: Every day | ORAL | Status: DC
  Administered 2012-08-19 – 2012-08-23 (×5): 1 via ORAL
  Filled 2012-08-19 (×5): qty 1

## 2012-08-19 MED ORDER — ENOXAPARIN SODIUM 60 MG/0.6ML ~~LOC~~ SOLN
60.0000 mg | SUBCUTANEOUS | Status: DC
  Administered 2012-08-19 – 2012-08-22 (×4): 60 mg via SUBCUTANEOUS
  Filled 2012-08-19 (×4): qty 0.6

## 2012-08-19 MED ORDER — LORAZEPAM 2 MG/ML IJ SOLN
1.0000 mg | Freq: Four times a day (QID) | INTRAMUSCULAR | Status: AC | PRN
  Administered 2012-08-20 – 2012-08-22 (×4): 1 mg via INTRAVENOUS
  Filled 2012-08-19 (×4): qty 1

## 2012-08-19 MED ORDER — PROMETHAZINE HCL 25 MG/ML IJ SOLN
12.5000 mg | Freq: Once | INTRAMUSCULAR | Status: AC
  Administered 2012-08-19: 12.5 mg via INTRAVENOUS
  Filled 2012-08-19: qty 1

## 2012-08-19 MED ORDER — POTASSIUM CHLORIDE 10 MEQ/100ML IV SOLN
10.0000 meq | Freq: Once | INTRAVENOUS | Status: AC
  Administered 2012-08-19: 10 meq via INTRAVENOUS
  Filled 2012-08-19 (×2): qty 100

## 2012-08-19 MED ORDER — THIAMINE HCL 100 MG/ML IJ SOLN: 100.0000 mg | Freq: Every day | INTRAMUSCULAR | Status: DC

## 2012-08-19 MED ORDER — ENOXAPARIN SODIUM 40 MG/0.4ML ~~LOC~~ SOLN: 40.0000 mg | SUBCUTANEOUS | Status: DC

## 2012-08-19 MED ORDER — POTASSIUM CHLORIDE 10 MEQ/100ML IV SOLN
10.0000 meq | Freq: Once | INTRAVENOUS | Status: AC
  Administered 2012-08-19: 10 meq via INTRAVENOUS

## 2012-08-19 MED ORDER — IOHEXOL 300 MG/ML  SOLN
50.0000 mL | Freq: Once | INTRAMUSCULAR | Status: AC | PRN
  Administered 2012-08-19: 50 mL via ORAL

## 2012-08-19 MED ORDER — POTASSIUM CHLORIDE IN NACL 40-0.9 MEQ/L-% IV SOLN
INTRAVENOUS | Status: DC
  Administered 2012-08-19 – 2012-08-21 (×4): via INTRAVENOUS

## 2012-08-19 MED ORDER — LEVALBUTEROL HCL 0.63 MG/3ML IN NEBU
0.6300 mg | INHALATION_SOLUTION | Freq: Four times a day (QID) | RESPIRATORY_TRACT | Status: DC
  Administered 2012-08-19 – 2012-08-20 (×5): 0.63 mg via RESPIRATORY_TRACT
  Filled 2012-08-19 (×5): qty 3

## 2012-08-19 MED ORDER — ONDANSETRON HCL 4 MG PO TABS: 4.0000 mg | ORAL_TABLET | Freq: Four times a day (QID) | ORAL | Status: DC | PRN

## 2012-08-19 MED ORDER — ONDANSETRON HCL 4 MG/2ML IJ SOLN: 4.0000 mg | Freq: Four times a day (QID) | INTRAMUSCULAR | Status: DC | PRN

## 2012-08-19 MED ORDER — VITAMIN B-1 100 MG PO TABS
100.0000 mg | ORAL_TABLET | Freq: Every day | ORAL | Status: DC
  Administered 2012-08-19 – 2012-08-23 (×5): 100 mg via ORAL
  Filled 2012-08-19 (×5): qty 1

## 2012-08-19 MED ORDER — ACETAMINOPHEN 325 MG PO TABS: 650.0000 mg | ORAL_TABLET | Freq: Four times a day (QID) | ORAL | Status: DC | PRN

## 2012-08-19 MED ORDER — LORAZEPAM 2 MG/ML IJ SOLN
0.0000 mg | Freq: Two times a day (BID) | INTRAMUSCULAR | Status: DC
  Administered 2012-08-21: 1 mg via INTRAVENOUS
  Administered 2012-08-22: 2 mg via INTRAVENOUS
  Administered 2012-08-22: 1 mg via INTRAVENOUS
  Filled 2012-08-19 (×2): qty 1

## 2012-08-19 MED ORDER — SODIUM CHLORIDE 0.9 % IV SOLN: INTRAVENOUS | Status: DC

## 2012-08-19 MED ORDER — SODIUM CHLORIDE 0.9 % IJ SOLN
3.0000 mL | Freq: Two times a day (BID) | INTRAMUSCULAR | Status: DC
  Administered 2012-08-20: 3 mL via INTRAVENOUS

## 2012-08-19 MED ORDER — SODIUM CHLORIDE 0.9 % IV BOLUS (SEPSIS)
500.0000 mL | Freq: Once | INTRAVENOUS | Status: AC
  Administered 2012-08-19: 500 mL via INTRAVENOUS

## 2012-08-19 MED ORDER — PNEUMOCOCCAL VAC POLYVALENT 25 MCG/0.5ML IJ INJ
0.5000 mL | INJECTION | INTRAMUSCULAR | Status: AC
  Administered 2012-08-20: 0.5 mL via INTRAMUSCULAR
  Filled 2012-08-19: qty 0.5

## 2012-08-19 MED ORDER — FUROSEMIDE 10 MG/ML IJ SOLN
20.0000 mg | Freq: Two times a day (BID) | INTRAMUSCULAR | Status: DC
  Administered 2012-08-19 – 2012-08-20 (×2): 20 mg via INTRAVENOUS
  Filled 2012-08-19 (×2): qty 2

## 2012-08-19 MED ORDER — IPRATROPIUM BROMIDE 0.02 % IN SOLN
0.5000 mg | Freq: Once | RESPIRATORY_TRACT | Status: AC
  Administered 2012-08-19: 0.5 mg via RESPIRATORY_TRACT
  Filled 2012-08-19: qty 2.5

## 2012-08-19 MED ORDER — IPRATROPIUM BROMIDE 0.02 % IN SOLN
0.5000 mg | Freq: Four times a day (QID) | RESPIRATORY_TRACT | Status: DC
  Administered 2012-08-19 – 2012-08-20 (×5): 0.5 mg via RESPIRATORY_TRACT
  Filled 2012-08-19 (×5): qty 2.5

## 2012-08-19 NOTE — H&P (Signed)
[facility] History and Physical  YEIDEN FRENKEL Thompson:[id number] DOB: 05-Mar-1965 DOA: 08/19/2012  Referring physician: Dr. Adriana Simas, ED physician PCP: Calla Kicks, MD [facility] Specialists:   Chief Complaint: abdominal pain  HPI: Stanley Thompson is a 47 y.o. male who presents to the emergency room with multiple complaints. He has noted worsening shortness of breath for the past several weeks. He does find some relief with his albuterol nebulizer, but since he is running low on medication, he has been rationing the medicine so that it will last longer. He has not noticed any change in his cough. He is unsure if she's had any fever. He recently quit smoking approximately one month ago. He is also complaining of right upper quadrant/lower quadrant abdominal pain. Patient reports that he drinks 5-6 shots of whiskey on a daily basis. He often becomes tremulous and undergoes withdrawal if he stops drinking. Abdominal pain associated with frequent vomiting. He also reports frequent bouts of diarrhea. Denies any hematemesis, hematochezia or melena. Patient also describes noticing that he's had worsening pedal edema over the past several weeks. He has used some of his wife's diuretic without any significant improvement. On arrival to the emergency room, he was noted to have an elevated lipase consistent with acute pancreatitis. Transaminases were also elevated in a pattern of alcohol abuse. He was noted to be diffusely wheezing and received a breathing treatment. Patient has been referred to the hospital for further evaluation.  Review of Systems: Pertinent positives as per history of present illness, otherwise negative  Past Medical History  Diagnosis Date  . Asthma   . Pneumonia   . Bronchitis    Past Surgical History  Procedure Laterality Date  . Back surgery     Social History:  reports that he has been smoking.  He does not have any smokeless tobacco history on  file. He reports that  drinks alcohol. He reports that he does not use illicit drugs.   Allergies  Allergen Reactions  . Advair Diskus (Fluticasone-Salmeterol) Swelling    throat    Family History  Problem Relation Age of Onset  . Hypertension Mother   . Cancer Mother   . Hypertension Father   . Heart failure Father   . Cancer Father     Prior to Admission medications   Medication Sig Start Date End Date Taking? Authorizing Provider  albuterol (PROVENTIL) (2.5 MG/3ML) 0.083% nebulizer solution Take 2.5 mg by nebulization every 6 (six) hours as needed. For shortness of breath/difficulty breathing   Yes Historical Provider, MD  aspirin EC 81 MG tablet Take 81 mg by mouth daily.   Yes Historical Provider, MD   Physical Exam: Filed Vitals:   08/19/12 1229 08/19/12 1339 08/19/12 1736  BP: 129/79  147/87  Pulse: 125  126  Temp: 98.2 F (36.8 C)    TempSrc: Oral    Resp: 20  18  Height: 6\' 2"  (1.88 m)    Weight: 115.667 kg (255 lb)    SpO2: 95% 96% 93%     General:  No acute distress  Eyes: Pupils are equal round react to light  ENT: Mucous membranes are moist  Neck: Supple  Cardiovascular: S1, S2, regular rate and rhythm  Respiratory: Bilateral rhonchi and mild wheeze  Abdomen: Soft, obese, tender in right upper and lower quadrants, bowel sounds are present in  Skin: No rashes  Musculoskeletal: 2+ pedal edema bilaterally  Psychiatric: Normal affect, cooperative with exam  Neurologic: Grossly intact, nonfocal  Labs on Admission:  Basic Metabolic Panel:  Recent Labs Lab 08/19/12 1318  NA 135  K 2.4*  CL 87*  CO2 28  GLUCOSE 130*  BUN 6  CREATININE 0.52  CALCIUM 8.3*   Liver Function Tests:  Recent Labs Lab 08/19/12 1318  AST 170*  ALT 26  ALKPHOS 252*  BILITOT 1.3*  PROT 6.2  ALBUMIN 2.5*    Recent Labs Lab 08/19/12 1318  LIPASE 540*   No results found for this basename: AMMONIA,  in the last 168 hours CBC:  Recent Labs Lab  08/19/12 1318  WBC 5.6  NEUTROABS 4.0  HGB 10.5*  HCT 30.3*  MCV 106.3*  PLT 184   Cardiac Enzymes:  Recent Labs Lab 08/19/12 1318  TROPONINI <0.30    BNP (last 3 results)  Recent Labs  08/19/12 1318  PROBNP 43.1   CBG: No results found for this basename: GLUCAP,  in the last 168 hours  Radiological Exams on Admission: Dg Chest 2 View  08/19/2012   *RADIOLOGY REPORT*  Clinical Data: Shortness of breath  CHEST - 2 VIEW  Comparison: 11/21/2007  Findings: The heart size and mediastinal contours are within normal limits.  Both lungs are clear.  The visualized skeletal structures are unremarkable.  IMPRESSION: Negative exam.   Original Report Authenticated By: Signa Kell, M.D.   Ct Abdomen Pelvis W Contrast  08/19/2012   *RADIOLOGY REPORT*  Clinical Data: Right sided abdominal pain and vomiting  CT ABDOMEN AND PELVIS WITH CONTRAST  Technique:  Multidetector CT imaging of the abdomen and pelvis was performed following the standard protocol during bolus administration of intravenous contrast.  Contrast: 50mL OMNIPAQUE IOHEXOL 300 MG/ML  SOLN, OMNIPAQUE IOHEXOL 300 MG/ML  SOLN  Comparison: None.  Findings:  There is geographic segmental retraction involving primarily the anterior aspect of the right lobe the liver as well as the medial segment of the left lobe of the liver.  There is diffuse decreased attenuation of the hepatic parenchyma suggestive of hepatic steatosis.  There are several laminated gallstones within the decompressed gallbladder.  No definite gallbladder wall thickening or pericholecystic fluid.  No definite intra or extrahepatic biliary duct dilatation.  No discrete hepatic lesion.  No definite ascites.  There is symmetric enhancement and excretion of the bilateral kidneys.  No definite renal stones on this post contrast examination.  No discrete renal lesions.  No urinary obstruction. There is grossly symmetric likely age related perinephric stranding.  Normal  appearance of the bilateral adrenal glands, pancreas and spleen.  There is rather diffuse, nonspecific within the abdominal mesentery.  The colon is under distended but otherwise normal. Incidental note is made of lipomatous hypertrophy of the ileocecal valve.  Normal appearance of the appendix.  No pneumoperitoneum, pneumatosis or portal venous gas.  There is minimal atherosclerotic plaque within a normal caliber abdominal aorta.  The major branch vessels of the abdominal aorta appear patent on this non CT examination.  Shoddy retroperitoneal and porta hepatis lymph nodes are not enlarged by C right criteria with index for hepatis node measuring approximately 9 mm in short axis diameter (image 34, series 2).  Normal appearance of the pelvic organs.  No free fluid within the pelvis.  Limited visualization of the lower thorax is negative for focal airspace opacity or pleural effusion.  Normal heart size.  No pericardial effusion.  No acute or aggressive osseous abnormalities.  Mild to moderate multilevel lumbar spine DDD, worse at L5 - S1 with disc space height  loss, end plate irregularity and posterior disc osteophyte complex at this level.  IMPRESSION: 1.  Nonspecific rather diffuse stranding within the abdominal mesentery presumably the sequela of volume overload/third spacing. Chest radiograph to evaluate for pulmonary edema may be performed as clinically indicated. 2.  No acute findings within the abdomen and pelvis, specifically, no evidence of enteric or urinary obstruction.  Normal appearance of the appendix. 3.  Cholelithiasis without evidence of cholecystitis. 4.  Hepatic steatosis with geographic atrophy of the liver adjacent to the gallbladder fossa but without discrete underlying hepatic lesion.  Further evaluation with abdominal MRI may be performed as clinically indicated.   Original Report Authenticated By: Tacey Ruiz, MD    EKG: Independently reviewed. Sinus tachycardia without any acute  changes  Assessment/Plan Principal Problem:   Acute pancreatitis Active Problems:   Alcohol abuse   COPD (chronic obstructive pulmonary disease)   Hypertension   Obesity   Hypokalemia   Cholelithiasis   Hypoalbuminemia   1. Acute pancreatitis. We'll keep the patient n.p.o. except ice chips. Advance diet as tolerated. Provide pain relief. Give IV fluids. Etiology is likely alcohol abuse. He does have cholelithiasis on CT scan but no evidence of cholecystitis. We will check abdominal ultrasound to further characterize this. 2. Alcohol abuse. Placed on alcohol withdrawal protocol. 3. COPD. Provide frequent nebulizer treatments. It does not appear he needs any steroids at this time. 4. Hypokalemia. Likely due to persistent vomiting. This will be replaced. Check magnesium 5. Lower extremity edema. Patient also has significant hypoalbuminemia. BNP was found to be normal range. This could be third spacing from low albumin. We'll start the patient on low-dose Lasix and monitor urine output. I suspect he may have some degree of diastolic dysfunction. He may need an echocardiogram. 6. Hypertension. Continue outpatient regimen.    Code Status: full code Family Communication: discussed with patient Disposition Plan: discharge home once improved  Time spent:  MEMON,JEHANZEB [facility] Pager [phone]  If 7PM-7AM, please contact night-coverage [email or web address] Password [facility] 08/19/2012, 7:02 PM

## 2012-08-19 NOTE — ED Notes (Signed)
Lab called with critical potassium of 2.4 on pt. Dr. Deretha Emory notified of result.

## 2012-08-19 NOTE — ED Provider Notes (Signed)
CT scan shows cholelithiasis without cholecystitis. Patient is hemodynamically stable. Admit to general medicine  Donnetta Hutching, MD 08/19/12 1735

## 2012-08-19 NOTE — ED Notes (Signed)
Complain of being SOB and n/v

## 2012-08-19 NOTE — ED Notes (Signed)
Pt c/o SOB along with NVD x1 week. Pt states symptoms have progressively worsened. Pt states he hasn't been able to eat in 5 days. Pt has hx of COPD and has been using nebulizer at home with minimal relief.

## 2012-08-19 NOTE — ED Provider Notes (Signed)
History    This chart was scribed for Stanley Jakes, MD by Leone Payor, ED Scribe. This patient was seen in room APA11/[facility]11 and the patient's care was started 12:39 PM.   CSN: [id number]  Arrival date & time 08/19/12  1227   First MD Initiated Contact with Patient 08/19/12 1237      Chief Complaint  Patient presents with  . Shortness of Breath     The history is provided by the patient. No language interpreter was used.    HPI Comments: BARRE Stanley Thompson is a 47 y.o. male with h/o COPD and asthma who presents to the Emergency Department complaining of constant, increased SOB that started 2 days ago. States he ran out of his albuterol nebulizer solution 2 days ago which is when the trouble started. Rates the abdominal pain as 8/10 and describes the pain as aching. He has associated nausea, vomiting (10-12 episodes), diarrhea (3-5 episodes). States he had a recent tick bite in the past 1 week. He denies blood in the vomit or diarrhea. Denies h/o heart trouble. He does not use O2 at home.    Past Medical History  Diagnosis Date  . Asthma   . Pneumonia   . Bronchitis     Past Surgical History  Procedure Laterality Date  . Back surgery      Family History  Problem Relation Age of Onset  . Hypertension Mother   . Cancer Mother   . Hypertension Father   . Heart failure Father   . Cancer Father     History  Substance Use Topics  . Smoking status: Current Every Day Smoker  . Smokeless tobacco: Not on file  . Alcohol Use: Yes     Comment: occasionally      Review of Systems  Constitutional: Negative for chills.  HENT: Positive for rhinorrhea. Negative for congestion.   Eyes: Positive for discharge (watery). Negative for visual disturbance.  Respiratory: Positive for cough (productive) and shortness of breath.   Cardiovascular: Positive for chest pain and leg swelling.  Gastrointestinal: Positive for nausea, vomiting, abdominal pain and diarrhea. Negative for  blood in stool.  Genitourinary: Negative for dysuria.  Musculoskeletal: Positive for myalgias and joint swelling (feet).  Skin: Negative for rash.  Neurological: Positive for light-headedness and headaches (mild). Negative for dizziness.  Hematological: Does not bruise/bleed easily.  Psychiatric/Behavioral: Negative for confusion.    Allergies  Advair diskus  Home Medications   Current Outpatient Rx  Name  Route  Sig  Dispense  Refill  . albuterol (PROVENTIL) (2.5 MG/3ML) 0.083% nebulizer solution   Nebulization   Take 2.5 mg by nebulization every 6 (six) hours as needed. For shortness of breath/difficulty breathing         . aspirin EC 81 MG tablet   Oral   Take 81 mg by mouth daily.           BP 129/79  Pulse 125  Temp(Src) 98.2 F (36.8 C) (Oral)  Resp 20  Ht 6\' 2"  (1.88 m)  Wt 255 lb (115.667 kg)  BMI 32.73 kg/m2  SpO2 96%  Physical Exam  Nursing note and vitals reviewed. Constitutional: He is oriented to person, place, and time. He appears well-developed and well-nourished. No distress.  HENT:  Head: Normocephalic and atraumatic.  Eyes: EOM are normal. Right eye exhibits discharge. Left eye exhibits discharge.  Eyes are not red.   Neck: Neck supple. No tracheal deviation present.  Cardiovascular: Normal rate, regular rhythm and normal  heart sounds.   No murmur heard. Pulmonary/Chest: Effort normal. No respiratory distress. He has wheezes (bilaterally).  Abdominal: Soft. Bowel sounds are normal. There is tenderness.  Increased tenderness on the R side. Minimal tenderness to L abdomen.   Musculoskeletal: Normal range of motion.  Lymphadenopathy:    He has no cervical adenopathy.  Neurological: He is alert and oriented to person, place, and time.  Skin: Skin is warm and dry.  Psychiatric: He has a normal mood and affect. His behavior is normal.    ED Course  Procedures (including critical care time)  DIAGNOSTIC STUDIES: Oxygen Saturation is 95% on  RA, adequate by my interpretation.    COORDINATION OF CARE: 12:45 PM Discussed treatment plan with pt at bedside and pt agreed to plan.   Labs Reviewed  COMPREHENSIVE METABOLIC PANEL - Abnormal; Notable for the following:    Potassium 2.4 (*)    Chloride 87 (*)    Glucose, Bld 130 (*)    Calcium 8.3 (*)    Albumin 2.5 (*)    AST 170 (*)    Alkaline Phosphatase 252 (*)    Total Bilirubin 1.3 (*)    All other components within normal limits  LIPASE, BLOOD - Abnormal; Notable for the following:    Lipase 540 (*)    All other components within normal limits  CBC WITH DIFFERENTIAL - Abnormal; Notable for the following:    RBC 2.85 (*)    Hemoglobin 10.5 (*)    HCT 30.3 (*)    MCV 106.3 (*)    MCH 36.8 (*)    All other components within normal limits  TROPONIN I  PRO B NATRIURETIC PEPTIDE  URINALYSIS, ROUTINE W REFLEX MICROSCOPIC   No results found.   Date: 08/19/2012  Rate: 103  Rhythm: normal sinus rhythm  QRS Axis: normal  Intervals: normal  ST/T Wave abnormalities: normal  Conduction Disutrbances:none  Narrative Interpretation:   Old EKG Reviewed: unchanged Secondly sinus tachycardia. EKG unchanged compared to 11/15/2007  Results for orders placed during the hospital encounter of 08/19/12  COMPREHENSIVE METABOLIC PANEL      Result Value Range   Sodium 135  135 - 145 mEq/L   Potassium 2.4 (*) 3.5 - 5.1 mEq/L   Chloride 87 (*) 96 - 112 mEq/L   CO2 28  19 - 32 mEq/L   Glucose, Bld 130 (*) 70 - 99 mg/dL   BUN 6  6 - 23 mg/dL   Creatinine, Ser 0.27  0.50 - 1.35 mg/dL   Calcium 8.3 (*) 8.4 - 10.5 mg/dL   Total Protein 6.2  6.0 - 8.3 g/dL   Albumin 2.5 (*) 3.5 - 5.2 g/dL   AST 253 (*) 0 - 37 U/L   ALT 26  0 - 53 U/L   Alkaline Phosphatase 252 (*) 39 - 117 U/L   Total Bilirubin 1.3 (*) 0.3 - 1.2 mg/dL   GFR calc non Af Amer >90  >90 mL/min   GFR calc Af Amer >90  >90 mL/min  LIPASE, BLOOD      Result Value Range   Lipase 540 (*) 11 - 59 U/L  CBC WITH  DIFFERENTIAL      Result Value Range   WBC 5.6  4.0 - 10.5 K/uL   RBC 2.85 (*) 4.22 - 5.81 MIL/uL   Hemoglobin 10.5 (*) 13.0 - 17.0 g/dL   HCT 66.4 (*) 40.3 - 47.4 %   MCV 106.3 (*) 78.0 - 100.0 fL   MCH 36.8 (*)  26.0 - 34.0 pg   MCHC 34.7  30.0 - 36.0 g/dL   RDW 40.9  81.1 - 91.4 %   Platelets 184  150 - 400 K/uL   Neutrophils Relative % 72  43 - 77 %   Neutro Abs 4.0  1.7 - 7.7 K/uL   Lymphocytes Relative 19  12 - 46 %   Lymphs Abs 1.0  0.7 - 4.0 K/uL   Monocytes Relative 9  3 - 12 %   Monocytes Absolute 0.5  0.1 - 1.0 K/uL   Eosinophils Relative 0  0 - 5 %   Eosinophils Absolute 0.0  0.0 - 0.7 K/uL   Basophils Relative 0  0 - 1 %   Basophils Absolute 0.0  0.0 - 0.1 K/uL  TROPONIN I      Result Value Range   Troponin I <0.30  <0.30 ng/mL  PRO B NATRIURETIC PEPTIDE      Result Value Range   Pro B Natriuretic peptide (BNP) 43.1  0 - 125 pg/mL     1. Pancreatitis   2. Hypokalemia       MDM   Labs and symptoms consistent with pancreatitis persistent vomiting marked hypokalemia. For the hypokalemia patient was treated with 10 mEq of potassium IV piggyback x2. CT scan is pending. Based on the frequency of vomiting which has continued patient will require admission. Patient's primary care doctors as well family practice. He lives in [place]. No prior history of pancreatitis according to the patient. Patient also has history of COPD and had bronchospasm he ran out of his albuterol inhaler past few days. Breathing has improved with the albuterol and Atrovent. Patient will be turned over to Dr. Adriana Simas to review the CT results and arrange for the admission.     I personally performed the services described in this documentation, which was scribed in my presence. The recorded information has been reviewed and is accurate.     Stanley Jakes, MD 08/19/12 [phone]

## 2012-08-20 ENCOUNTER — Inpatient Hospital Stay (HOSPITAL_COMMUNITY): Payer: Self-pay

## 2012-08-20 ENCOUNTER — Other Ambulatory Visit (HOSPITAL_COMMUNITY): Payer: Self-pay

## 2012-08-20 DIAGNOSIS — K8020 Calculus of gallbladder without cholecystitis without obstruction: Secondary | ICD-10-CM

## 2012-08-20 LAB — COMPREHENSIVE METABOLIC PANEL
ALT: 25 U/L (ref 0–53)
AST: 145 U/L — ABNORMAL HIGH (ref 0–37)
Albumin: 2.6 g/dL — ABNORMAL LOW (ref 3.5–5.2)
Calcium: 7.9 mg/dL — ABNORMAL LOW (ref 8.4–10.5)
Creatinine, Ser: 0.54 mg/dL (ref 0.50–1.35)
GFR calc non Af Amer: >90 mL/min (ref >90)
Sodium: 136 mEq/L (ref 135–145)
Total Protein: 6.4 g/dL (ref 6.0–8.3)

## 2012-08-20 LAB — CBC
HCT: 31.9 % — ABNORMAL LOW (ref 39.0–52.0)
Hemoglobin: 10.6 g/dL — ABNORMAL LOW (ref 13.0–17.0)
MCH: 36.2 pg — ABNORMAL HIGH (ref 26.0–34.0)
MCHC: 33.2 g/dL (ref 30.0–36.0)
MCV: 108.9 fL — ABNORMAL HIGH (ref 78.0–100.0)
RBC: 2.93 MIL/uL — ABNORMAL LOW (ref 4.22–5.81)

## 2012-08-20 MED ORDER — LEVALBUTEROL HCL 0.63 MG/3ML IN NEBU
0.6300 mg | INHALATION_SOLUTION | RESPIRATORY_TRACT | Status: DC
  Administered 2012-08-20 – 2012-08-23 (×15): 0.63 mg via RESPIRATORY_TRACT
  Filled 2012-08-20 (×16): qty 3

## 2012-08-20 MED ORDER — MAGNESIUM SULFATE 40 MG/ML IJ SOLN
4.0000 g | Freq: Once | INTRAMUSCULAR | Status: AC
  Administered 2012-08-20: 4 g via INTRAVENOUS
  Filled 2012-08-20: qty 100

## 2012-08-20 MED ORDER — IPRATROPIUM BROMIDE 0.02 % IN SOLN
0.5000 mg | RESPIRATORY_TRACT | Status: DC
  Administered 2012-08-20 – 2012-08-23 (×15): 0.5 mg via RESPIRATORY_TRACT
  Filled 2012-08-20 (×16): qty 2.5

## 2012-08-20 MED ORDER — NICOTINE 14 MG/24HR TD PT24
14.0000 mg | MEDICATED_PATCH | Freq: Every day | TRANSDERMAL | Status: DC
  Administered 2012-08-20 – 2012-08-23 (×4): 14 mg via TRANSDERMAL
  Filled 2012-08-20 (×5): qty 1

## 2012-08-20 NOTE — Progress Notes (Signed)
[facility] PROGRESS NOTE  Stanley Thompson ZOX:[id number] DOB: 1965-07-29 DOA: 08/19/2012 PCP: Calla Kicks, MD  Assessment/Plan: 1. Acute pancreatitis. Likely related to alcohol abuse. The patient does have gallstones on ultrasound but no evidence of cholecystitis. Patient to have outpatient surgical followup to consider cholecystectomy. Patient is still having significant pain with water and ice chips. Will monitor to guide any further today. Continue IV fluids and pain management. 2. Alcohol abuse. Patient is somewhat tremulous. We will continue alcohol withdrawal protocol. 3. COPD. Wheezing appears to have improved from yesterday. Continue nebulizer treatments. 4. Hypokalemia. Improved replacement. We'll also replace magnesium. 5. Lower extremity edema. Possibly related to hypoalbuminemia. We'll hold off on Lasix right now since he required fluid for pancreatitis. 6. Hypertension. Stable.  Code Status: Full code Family Communication: Discussed with patient Disposition Plan: Discharge home once improved   Consultants:  None  Procedures:  None  Antibiotics:  None  HPI/Subjective: He feels that his breathing is doing better today. He still having abdominal discomfort when he takes any sips of water or has ice.  Objective: Filed Vitals:   08/20/12 0518 08/20/12 0644 08/20/12 0733 08/20/12 0758  BP:  129/87 137/83   Pulse: 101 98 112   Temp:  97.3 F (36.3 C)    TempSrc:      Resp:  22 20   Height:      Weight:      SpO2:  96% 94% 92%    Intake/Output Summary (Last 24 hours) at 08/20/12 1322 Last data filed at 08/20/12 0557  Gross per 24 hour  Intake 933.33 ml  Output    800 ml  Net 133.33 ml   Filed Weights   08/19/12 1229 08/19/12 2006 08/20/12 0500  Weight: 115.667 kg (255 lb) 117.9 kg (259 lb 14.8 oz) 118.3 kg (260 lb 12.9 oz)    Exam:   General:  No acute distress  Cardiovascular: S1, S2, tachycardic  Respiratory: Mild expiratory  wheezes bilaterally  Abdomen: Soft, nontender, positive bowel sounds  Musculoskeletal: One plus pitting edema bilaterally   Data Reviewed: Basic Metabolic Panel:  Recent Labs Lab 08/19/12 1318 08/20/12 0552  NA 135 136  K 2.4* 3.7  CL 87* 94*  CO2 28 35*  GLUCOSE 130* 96  BUN 6 6  CREATININE 0.52 0.54  CALCIUM 8.3* 7.9*  MG 1.1*  --    Liver Function Tests:  Recent Labs Lab 08/19/12 1318 08/20/12 0552  AST 170* 145*  ALT 26 25  ALKPHOS 252* 241*  BILITOT 1.3* 1.6*  PROT 6.2 6.4  ALBUMIN 2.5* 2.6*    Recent Labs Lab 08/19/12 1318 08/20/12 0744  LIPASE 540* 449*   No results found for this basename: AMMONIA,  in the last 168 hours CBC:  Recent Labs Lab 08/19/12 1318 08/20/12 0552  WBC 5.6 5.9  NEUTROABS 4.0  --   HGB 10.5* 10.6*  HCT 30.3* 31.9*  MCV 106.3* 108.9*  PLT 184 181   Cardiac Enzymes:  Recent Labs Lab 08/19/12 1318  TROPONINI <0.30   BNP (last 3 results)  Recent Labs  08/19/12 1318  PROBNP 43.1   CBG: No results found for this basename: GLUCAP,  in the last 168 hours  No results found for this or any previous visit (from the past 240 hour(s)).   Studies: Dg Chest 2 View  08/19/2012   *RADIOLOGY REPORT*  Clinical Data: Shortness of breath  CHEST - 2 VIEW  Comparison: 11/21/2007  Findings: The heart size and mediastinal  contours are within normal limits.  Both lungs are clear.  The visualized skeletal structures are unremarkable.  IMPRESSION: Negative exam.   Original Report Authenticated By: Signa Kell, M.D.   Ct Abdomen Pelvis W Contrast  08/19/2012   *RADIOLOGY REPORT*  Clinical Data: Right sided abdominal pain and vomiting  CT ABDOMEN AND PELVIS WITH CONTRAST  Technique:  Multidetector CT imaging of the abdomen and pelvis was performed following the standard protocol during bolus administration of intravenous contrast.  Contrast: 50mL OMNIPAQUE IOHEXOL 300 MG/ML  SOLN, OMNIPAQUE IOHEXOL 300 MG/ML  SOLN  Comparison:  None.  Findings:  There is geographic segmental retraction involving primarily the anterior aspect of the right lobe the liver as well as the medial segment of the left lobe of the liver.  There is diffuse decreased attenuation of the hepatic parenchyma suggestive of hepatic steatosis.  There are several laminated gallstones within the decompressed gallbladder.  No definite gallbladder wall thickening or pericholecystic fluid.  No definite intra or extrahepatic biliary duct dilatation.  No discrete hepatic lesion.  No definite ascites.  There is symmetric enhancement and excretion of the bilateral kidneys.  No definite renal stones on this post contrast examination.  No discrete renal lesions.  No urinary obstruction. There is grossly symmetric likely age related perinephric stranding.  Normal appearance of the bilateral adrenal glands, pancreas and spleen.  There is rather diffuse, nonspecific within the abdominal mesentery.  The colon is under distended but otherwise normal. Incidental note is made of lipomatous hypertrophy of the ileocecal valve.  Normal appearance of the appendix.  No pneumoperitoneum, pneumatosis or portal venous gas.  There is minimal atherosclerotic plaque within a normal caliber abdominal aorta.  The major branch vessels of the abdominal aorta appear patent on this non CT examination.  Shoddy retroperitoneal and porta hepatis lymph nodes are not enlarged by C right criteria with index for hepatis node measuring approximately 9 mm in short axis diameter (image 34, series 2).  Normal appearance of the pelvic organs.  No free fluid within the pelvis.  Limited visualization of the lower thorax is negative for focal airspace opacity or pleural effusion.  Normal heart size.  No pericardial effusion.  No acute or aggressive osseous abnormalities.  Mild to moderate multilevel lumbar spine DDD, worse at L5 - S1 with disc space height loss, end plate irregularity and posterior disc osteophyte complex  at this level.  IMPRESSION: 1.  Nonspecific rather diffuse stranding within the abdominal mesentery presumably the sequela of volume overload/third spacing. Chest radiograph to evaluate for pulmonary edema may be performed as clinically indicated. 2.  No acute findings within the abdomen and pelvis, specifically, no evidence of enteric or urinary obstruction.  Normal appearance of the appendix. 3.  Cholelithiasis without evidence of cholecystitis. 4.  Hepatic steatosis with geographic atrophy of the liver adjacent to the gallbladder fossa but without discrete underlying hepatic lesion.  Further evaluation with abdominal MRI may be performed as clinically indicated.   Original Report Authenticated By: Tacey Ruiz, MD   US Abdomen Limited Ruq  08/20/2012   *RADIOLOGY REPORT*  Clinical Data:  Pancreatitis  LIMITED ABDOMINAL ULTRASOUND - RIGHT UPPER QUADRANT  Comparison:  CT 08/19/2012  Findings:  Gallbladder:  Gallbladder poorly visualized secondary to gallstones within the lumen.  Common bile duct:  Not well seen secondary to increased hepatic echogenicity.  Appears normal at 5 mm.  Liver:  The liver is increased in parenchymal echogenicity.  No ductal dilatation.  IMPRESSION:  1.  Cholelithiasis. 2.  Echogenic liver commonly represents hepatic steatosis.  Cannot exclude underlying hepatocellular disease. 3.  Exam is somewhat limited due to increased liver echogenicity.                    Original Report Authenticated By: Genevive Bi, M.D.    Scheduled Meds: . sodium chloride   Intravenous STAT  . enoxaparin (LOVENOX) injection  60 mg Subcutaneous Q24H  . folic acid  1 mg Oral Daily  . furosemide  20 mg Intravenous BID  . guaiFENesin  1,200 mg Oral BID  . ipratropium  0.5 mg Nebulization Q6H  . levalbuterol  0.63 mg Nebulization Q6H  . LORazepam  0-4 mg Intravenous Q6H   Followed by  . [START ON 08/21/2012] LORazepam  0-4 mg Intravenous Q12H  . multivitamin with minerals  1 tablet Oral Daily  .  nicotine  14 mg Transdermal Daily  . sodium chloride  3 mL Intravenous Q12H  . thiamine  100 mg Oral Daily   Or  . thiamine  100 mg Intravenous Daily   Continuous Infusions: . 0.9 % NaCl with KCl 40 mEq / L 100 mL/hr at 08/20/12 1610    Principal Problem:   Acute pancreatitis Active Problems:   Alcohol abuse   COPD (chronic obstructive pulmonary disease)   Hypertension   Obesity   Hypokalemia   Cholelithiasis   Hypoalbuminemia    Time spent:    Shakoya Gilmore  [facility] Pager [phone]. If 7PM-7AM, please contact night-coverage at [email or web address], password [facility] 08/20/2012, 1:22 PM  LOS: 1 day

## 2012-08-20 NOTE — Progress Notes (Signed)
Patient fell this morning at approximately 0725.  NT had helped patient into bathroom and closed door to give patient privacy.  Patient then fell in floor on his bottom.  States he felt dizzy.  VSS.  Patient able to get up easily off floor to continue using bathroom, helped back to bed by NT.  Patient is A&O x 3, and was initially independent with ADLs.  Patient has ETOH abuse hx and appears to have some tremors.  Patient suffered no injuries, bruises, skin tears, etc.  States he did not hit his head.  Neuro status WNL.  CIWA protocol already in place.  Patients bed alarm now on, call bell with in reach.  Instructed patient to call when needing to get OOB, pt verbalizes understanding.  Red socks in room to put on when getting OOB.  MD made aware.  Pt has no complaints, states he feels fine other than abdominal tenderness.

## 2012-08-20 NOTE — Progress Notes (Signed)
Pt treatment schedule changed to Q4w/a -xopenex/atrovent this should give better coverage  for his wheezing .

## 2012-08-21 LAB — COMPREHENSIVE METABOLIC PANEL
ALT: 25 U/L (ref 0–53)
Albumin: 2.4 g/dL — ABNORMAL LOW (ref 3.5–5.2)
Alkaline Phosphatase: 229 U/L — ABNORMAL HIGH (ref 39–117)
BUN: 4 mg/dL — ABNORMAL LOW (ref 6–23)
Chloride: 94 mEq/L — ABNORMAL LOW (ref 96–112)
GFR calc Af Amer: >90 mL/min (ref >90)
Glucose, Bld: 81 mg/dL (ref 70–99)
Potassium: 4.8 mEq/L (ref 3.5–5.1)
Sodium: 132 mEq/L — ABNORMAL LOW (ref 135–145)
Total Bilirubin: 1.7 mg/dL — ABNORMAL HIGH (ref 0.3–1.2)
Total Protein: 6 g/dL (ref 6.0–8.3)

## 2012-08-21 LAB — CBC
HCT: 30.5 % — ABNORMAL LOW (ref 39.0–52.0)
Hemoglobin: 10.2 g/dL — ABNORMAL LOW (ref 13.0–17.0)
MCH: 37 pg — ABNORMAL HIGH (ref 26.0–34.0)
MCHC: 33.4 g/dL (ref 30.0–36.0)
RDW: 14.8 % (ref 11.5–15.5)

## 2012-08-21 MED ORDER — ALUM & MAG HYDROXIDE-SIMETH 200-200-20 MG/5ML PO SUSP
15.0000 mL | Freq: Four times a day (QID) | ORAL | Status: DC | PRN
  Administered 2012-08-21 – 2012-08-23 (×4): 15 mL via ORAL
  Filled 2012-08-21 (×4): qty 30

## 2012-08-21 MED ORDER — SODIUM CHLORIDE 0.9 % IV SOLN
INTRAVENOUS | Status: DC
  Administered 2012-08-21 – 2012-08-22 (×2): via INTRAVENOUS

## 2012-08-21 NOTE — Progress Notes (Signed)
[facility] PROGRESS NOTE  Stanley Thompson EAV:[id number] DOB: 11-Nov-1965 DOA: 08/19/2012 PCP: Calla Kicks, MD  Assessment/Plan: 1. Acute pancreatitis. Likely related to alcohol abuse. The patient does have gallstones on ultrasound but no evidence of cholecystitis. Patient to have outpatient surgical followup to consider cholecystectomy. Abdominal pain appears to be improving. Patient wishes to advance diet. We'll start clear liquids today. Continue IV fluids and pain management. 2. Alcohol abuse. Tremors appear to be improving. We will continue alcohol withdrawal protocol. 3. COPD. Wheezing appears to have improved from yesterday. Continue nebulizer treatments. 4. Hypokalemia. Improved replacement. We'll also replace magnesium. 5. Lower extremity edema. Possibly related to hypoalbuminemia. We'll hold off on Lasix right now since he requires fluid for pancreatitis. Check 2-D echocardiogram in light of his chronic alcohol abuse to evaluate for dilated cardiomyopathy. 6. Hypertension. Stable.  Code Status: Full code Family Communication: Discussed with patient Disposition Plan: Discharge home once improved   Consultants:  None  Procedures:  None  Antibiotics:  None  HPI/Subjective: Abdominal pain is improving. He wishes to advance diet. Shortness of breath is also improving. Wheezing improving.  Objective: Filed Vitals:   08/21/12 0207 08/21/12 0515 08/21/12 0730 08/21/12 1433  BP:  116/82  121/76  Pulse:  94  102  Temp:  98.4 F (36.9 C)  98.3 F (36.8 C)  TempSrc:  Oral    Resp:  18  20  Height:      Weight:  118.6 kg (261 lb 7.5 oz)    SpO2: 95% 94% 95% 94%    Intake/Output Summary (Last 24 hours) at 08/21/12 1525 Last data filed at 08/21/12 0810  Gross per 24 hour  Intake   2250 ml  Output   1025 ml  Net   1225 ml   Filed Weights   08/19/12 2006 08/20/12 0500 08/21/12 0515  Weight: 117.9 kg (259 lb 14.8 oz) 118.3 kg (260 lb 12.9 oz) 118.6 kg  (261 lb 7.5 oz)    Exam:   General:  No acute distress  Cardiovascular: S1, S2, tachycardic  Respiratory: Mild expiratory wheezes bilaterally  Abdomen: Soft, nontender, positive bowel sounds  Musculoskeletal: One plus pitting edema bilaterally   Data Reviewed: Basic Metabolic Panel:  Recent Labs Lab 08/19/12 1318 08/20/12 0552 08/21/12 0539  NA 135 136 132*  K 2.4* 3.7 4.8  CL 87* 94* 94*  CO2 28 35* 30  GLUCOSE 130* 96 81  BUN 6 6 4*  CREATININE 0.52 0.54 0.55  CALCIUM 8.3* 7.9* 8.0*  MG 1.1*  --   --    Liver Function Tests:  Recent Labs Lab 08/19/12 1318 08/20/12 0552 08/21/12 0539  AST 170* 145* 154*  ALT 26 25 25   ALKPHOS 252* 241* 229*  BILITOT 1.3* 1.6* 1.7*  PROT 6.2 6.4 6.0  ALBUMIN 2.5* 2.6* 2.4*    Recent Labs Lab 08/19/12 1318 08/20/12 0744 08/21/12 0539  LIPASE 540* 449* 253*   No results found for this basename: AMMONIA,  in the last 168 hours CBC:  Recent Labs Lab 08/19/12 1318 08/20/12 0552 08/21/12 0539  WBC 5.6 5.9 10.1  NEUTROABS 4.0  --   --   HGB 10.5* 10.6* 10.2*  HCT 30.3* 31.9* 30.5*  MCV 106.3* 108.9* 110.5*  PLT 184 181 181   Cardiac Enzymes:  Recent Labs Lab 08/19/12 1318  TROPONINI <0.30   BNP (last 3 results)  Recent Labs  08/19/12 1318  PROBNP 43.1   CBG: No results found for this basename: GLUCAP,  in the last 168 hours  No results found for this or any previous visit (from the past 240 hour(s)).   Studies: Dg Chest 2 View  08/19/2012   *RADIOLOGY REPORT*  Clinical Data: Shortness of breath  CHEST - 2 VIEW  Comparison: 11/21/2007  Findings: The heart size and mediastinal contours are within normal limits.  Both lungs are clear.  The visualized skeletal structures are unremarkable.  IMPRESSION: Negative exam.   Original Report Authenticated By: Signa Kell, M.D.   Ct Abdomen Pelvis W Contrast  08/19/2012   *RADIOLOGY REPORT*  Clinical Data: Right sided abdominal pain and vomiting  CT ABDOMEN  AND PELVIS WITH CONTRAST  Technique:  Multidetector CT imaging of the abdomen and pelvis was performed following the standard protocol during bolus administration of intravenous contrast.  Contrast: 50mL OMNIPAQUE IOHEXOL 300 MG/ML  SOLN, OMNIPAQUE IOHEXOL 300 MG/ML  SOLN  Comparison: None.  Findings:  There is geographic segmental retraction involving primarily the anterior aspect of the right lobe the liver as well as the medial segment of the left lobe of the liver.  There is diffuse decreased attenuation of the hepatic parenchyma suggestive of hepatic steatosis.  There are several laminated gallstones within the decompressed gallbladder.  No definite gallbladder wall thickening or pericholecystic fluid.  No definite intra or extrahepatic biliary duct dilatation.  No discrete hepatic lesion.  No definite ascites.  There is symmetric enhancement and excretion of the bilateral kidneys.  No definite renal stones on this post contrast examination.  No discrete renal lesions.  No urinary obstruction. There is grossly symmetric likely age related perinephric stranding.  Normal appearance of the bilateral adrenal glands, pancreas and spleen.  There is rather diffuse, nonspecific within the abdominal mesentery.  The colon is under distended but otherwise normal. Incidental note is made of lipomatous hypertrophy of the ileocecal valve.  Normal appearance of the appendix.  No pneumoperitoneum, pneumatosis or portal venous gas.  There is minimal atherosclerotic plaque within a normal caliber abdominal aorta.  The major branch vessels of the abdominal aorta appear patent on this non CT examination.  Shoddy retroperitoneal and porta hepatis lymph nodes are not enlarged by C right criteria with index for hepatis node measuring approximately 9 mm in short axis diameter (image 34, series 2).  Normal appearance of the pelvic organs.  No free fluid within the pelvis.  Limited visualization of the lower thorax is negative for  focal airspace opacity or pleural effusion.  Normal heart size.  No pericardial effusion.  No acute or aggressive osseous abnormalities.  Mild to moderate multilevel lumbar spine DDD, worse at L5 - S1 with disc space height loss, end plate irregularity and posterior disc osteophyte complex at this level.  IMPRESSION: 1.  Nonspecific rather diffuse stranding within the abdominal mesentery presumably the sequela of volume overload/third spacing. Chest radiograph to evaluate for pulmonary edema may be performed as clinically indicated. 2.  No acute findings within the abdomen and pelvis, specifically, no evidence of enteric or urinary obstruction.  Normal appearance of the appendix. 3.  Cholelithiasis without evidence of cholecystitis. 4.  Hepatic steatosis with geographic atrophy of the liver adjacent to the gallbladder fossa but without discrete underlying hepatic lesion.  Further evaluation with abdominal MRI may be performed as clinically indicated.   Original Report Authenticated By: Tacey Ruiz, MD   US Abdomen Limited Ruq  08/20/2012   *RADIOLOGY REPORT*  Clinical Data:  Pancreatitis  LIMITED ABDOMINAL ULTRASOUND - RIGHT UPPER QUADRANT  Comparison:  CT 08/19/2012  Findings:  Gallbladder:  Gallbladder poorly visualized secondary to gallstones within the lumen.  Common bile duct:  Not well seen secondary to increased hepatic echogenicity.  Appears normal at 5 mm.  Liver:  The liver is increased in parenchymal echogenicity.  No ductal dilatation.  IMPRESSION:  1.  Cholelithiasis. 2.  Echogenic liver commonly represents hepatic steatosis.  Cannot exclude underlying hepatocellular disease. 3.  Exam is somewhat limited due to increased liver echogenicity.                    Original Report Authenticated By: Genevive Bi, M.D.    Scheduled Meds: . enoxaparin (LOVENOX) injection  60 mg Subcutaneous Q24H  . folic acid  1 mg Oral Daily  . guaiFENesin  1,200 mg Oral BID  . ipratropium  0.5 mg Nebulization Q4H  WA  . levalbuterol  0.63 mg Nebulization Q4H WA  . LORazepam  0-4 mg Intravenous Q6H   Followed by  . LORazepam  0-4 mg Intravenous Q12H  . multivitamin with minerals  1 tablet Oral Daily  . nicotine  14 mg Transdermal Daily  . sodium chloride  3 mL Intravenous Q12H  . thiamine  100 mg Oral Daily   Or  . thiamine  100 mg Intravenous Daily   Continuous Infusions:    Principal Problem:   Acute pancreatitis Active Problems:   Alcohol abuse   COPD (chronic obstructive pulmonary disease)   Hypertension   Obesity   Hypokalemia   Cholelithiasis   Hypoalbuminemia    Time spent:    MEMON,JEHANZEB  [facility] Pager [phone]. If 7PM-7AM, please contact night-coverage at [email or web address], password [facility] 08/21/2012, 3:25 PM  LOS: 2 days

## 2012-08-22 DIAGNOSIS — I517 Cardiomegaly: Secondary | ICD-10-CM

## 2012-08-22 LAB — CBC
MCH: 36.4 pg — ABNORMAL HIGH (ref 26.0–34.0)
MCHC: 33 g/dL (ref 30.0–36.0)
Platelets: 215 10*3/uL (ref 150–400)
RDW: 14.6 % (ref 11.5–15.5)

## 2012-08-22 LAB — BASIC METABOLIC PANEL
BUN: 3 mg/dL — ABNORMAL LOW (ref 6–23)
Calcium: 8.5 mg/dL (ref 8.4–10.5)
GFR calc Af Amer: >90 mL/min (ref >90)
GFR calc non Af Amer: >90 mL/min (ref >90)
Glucose, Bld: 92 mg/dL (ref 70–99)

## 2012-08-22 LAB — LIPASE, BLOOD: Lipase: 299 U/L — ABNORMAL HIGH (ref 11–59)

## 2012-08-22 NOTE — Progress Notes (Signed)
Pt fell this a.m. At approximately 1014 in the bathroom.  NT had ambulated pt to bathroom for toileting.  NT was standing in doorway of bathroom.  Pt was leaning forward to pull his pants down and went forward landing on his right knee.  Pt was able to get back up to the toilet without difficulty.  Pt has no c/o pain.  A&O x 3.  Pt states that he "just lost my balance."  Pt has no injuries.  Neuro WNL.  Bed alarm being used.  Pt reminded to call for assistance and verbalized understanding.  MD notified via text page.

## 2012-08-22 NOTE — Plan of Care (Signed)
Problem: Phase III Progression Outcomes Goal: Tolerating diet Outcome: Completed/Met Date Met:  08/22/12 Pt's diet advanced to full liquid at lunch today.  Tolerated well.  Pt's diet for evening meal advanced to regular diet.  Tolerating well.

## 2012-08-22 NOTE — Plan of Care (Signed)
Problem: Phase III Progression Outcomes Goal: Discharge plan remains appropriate-arrangements made Outcome: Completed/Met Date Met:  08/22/12 Possible discharge home on Tuesday.

## 2012-08-22 NOTE — Progress Notes (Signed)
*  PRELIMINARY RESULTS* Echocardiogram 2D Echocardiogram has been performed.  Conrad Greenbrier 08/22/2012, 10:45 AM

## 2012-08-22 NOTE — Progress Notes (Signed)
UR Chart Review Completed  

## 2012-08-22 NOTE — Progress Notes (Signed)
Stanley Thompson [id number] DOB: 01/20/1966 DOA: 08/19/2012 PCP: Calla Kicks, MD   Subjective: This man came in with acute pancreatitis, probably related to alcohol abuse. He also does have the presence of gallstones. He is improving. He has tolerated his clear fluid diet and does not have any significant abdominal pain or nausea or vomiting.           Physical Exam: Blood pressure 121/83, pulse 106, temperature 98.7 F (37.1 C), temperature source Oral, resp. rate 18, height 6\' 2"  (1.88 m), weight 118.3 kg (260 lb 12.9 oz), SpO2 96.00%. He looks systemically well. He looks much older than his stated age of only 18. Abdomen is soft and nontender. Lung fields are clear with occasional wheezing, this is likely his baseline COPD. Heart sounds are present without gallop rhythm. There is no clinical evidence of heart failure. He is alert and orientated with no evidence of alcohol withdrawal. There are no focal neurological signs.   Investigations:     Basic Metabolic Panel:  Recent Labs  27/25/36 1318  08/21/12 0539 08/22/12 0441  NA 135  < > 132* 132*  K 2.4*  < > 4.8 4.8  CL 87*  < > 94* 96  CO2 28  < > 30 29  GLUCOSE 130*  < > 81 92  BUN 6  < > 4* 3*  CREATININE 0.52  < > 0.55 0.58  CALCIUM 8.3*  < > 8.0* 8.5  MG 1.1*  --   --   --   < > = values in this interval not displayed. Liver Function Tests:  Recent Labs  08/20/12 0552 08/21/12 0539  AST 145* 154*  ALT 25 25  ALKPHOS 241* 229*  BILITOT 1.6* 1.7*  PROT 6.4 6.0  ALBUMIN 2.6* 2.4*     CBC:  Recent Labs  08/19/12 1318  08/21/12 0539 08/22/12 0441  WBC 5.6  < > 10.1 7.9  NEUTROABS 4.0  --   --   --   HGB 10.5*  < > 10.2* 10.8*  HCT 30.3*  < > 30.5* 32.7*  MCV 106.3*  < > 110.5* 110.1*  PLT 184  < > 181 215  < > = values in this interval not displayed.  US Abdomen Limited Ruq  08/20/2012   *RADIOLOGY REPORT*  Clinical Data:  Pancreatitis  LIMITED ABDOMINAL ULTRASOUND - RIGHT UPPER  QUADRANT  Comparison:  CT 08/19/2012  Findings:  Gallbladder:  Gallbladder poorly visualized secondary to gallstones within the lumen.  Common bile duct:  Not well seen secondary to increased hepatic echogenicity.  Appears normal at 5 mm.  Liver:  The liver is increased in parenchymal echogenicity.  No ductal dilatation.  IMPRESSION:  1.  Cholelithiasis. 2.  Echogenic liver commonly represents hepatic steatosis.  Cannot exclude underlying hepatocellular disease. 3.  Exam is somewhat limited due to increased liver echogenicity.                    Original Report Authenticated By: Genevive Bi, M.D.      Medications: I have reviewed the patient's current medications.  Impression: 1. Acute pancreatitis, likely etiologies of alcoholism. 2. Alcoholism. 3. COPD, stable. 4. Hypertension. 5. Cholelithiasis, probably incidental. Ultrasound report is suggestive of hepatic steatosis .     Plan: 1. Advance diet. Watch for signs of alcohol withdrawal. 2. If he continues to improve, probable discharge home tomorrow. Will need outpatient followup regarding cholelithiasis and possible surgical referral.  Consultants:  None.   Procedures:  None.   Antibiotics:  None.                   Code Status: Full code.  Family Communication: Discussed plan with patient at the bedside.   Disposition Plan: Home when medically stable, probably tomorrow.  Time spent: 20 minutes.   LOS: 3 days   Stanley Thompson Pager [phone]  08/22/2012, 8:50 AM

## 2012-08-23 LAB — COMPREHENSIVE METABOLIC PANEL
ALT: 23 U/L (ref 0–53)
BUN: 3 mg/dL — ABNORMAL LOW (ref 6–23)
Calcium: 8.3 mg/dL — ABNORMAL LOW (ref 8.4–10.5)
Creatinine, Ser: 0.63 mg/dL (ref 0.50–1.35)
GFR calc Af Amer: >90 mL/min (ref >90)
Glucose, Bld: 100 mg/dL — ABNORMAL HIGH (ref 70–99)
Sodium: 135 mEq/L (ref 135–145)
Total Protein: 5.6 g/dL — ABNORMAL LOW (ref 6.0–8.3)

## 2012-08-23 LAB — CBC
Hemoglobin: 9.7 g/dL — ABNORMAL LOW (ref 13.0–17.0)
MCH: 36.2 pg — ABNORMAL HIGH (ref 26.0–34.0)
MCHC: 32.4 g/dL (ref 30.0–36.0)
MCV: 111.6 fL — ABNORMAL HIGH (ref 78.0–100.0)

## 2012-08-23 LAB — PROTIME-INR
INR: 1.02 (ref 0.00–1.49)
Prothrombin Time: 13.3 seconds (ref 11.6–15.2)

## 2012-08-23 MED ORDER — ALBUTEROL SULFATE (2.5 MG/3ML) 0.083% IN NEBU: 2.5000 mg | INHALATION_SOLUTION | Freq: Four times a day (QID) | RESPIRATORY_TRACT | Status: DC | PRN

## 2012-08-23 MED ORDER — FOLIC ACID 1 MG PO TABS: 1.0000 mg | ORAL_TABLET | Freq: Every day | ORAL | Status: DC

## 2012-08-23 MED ORDER — THIAMINE HCL 100 MG PO TABS: 100.0000 mg | ORAL_TABLET | Freq: Every day | ORAL | Status: DC

## 2012-08-23 NOTE — Discharge Summary (Signed)
Physician Discharge Summary  Stanley Thompson EAV:[id number] DOB: [birth year] DOA: 08/19/2012  PCP: Stanley Kicks, MD  Admit date: 08/19/2012 Discharge date: 08/23/2012  Time spent: Greater than 30 minutes  Recommendations for Outpatient Follow-up:  1. Follow with primary care physician in one week. Consider surgical referral for cholelithiasis.  Discharge Diagnoses:  1. Acute pancreatitis, likely secondary to alcoholism. 2. Alcohol abuse. 3. Cholelithiasis. Probable hepatic steatosis. 4. COPD, stable. 5. Hypertension. 6. Hypertension.   Discharge Condition: Stable and improved.  Diet recommendation: Regular. No alcohol.  Filed Weights   08/21/12 0515 08/22/12 0500 08/23/12 0655  Weight: 118.6 kg (261 lb 7.5 oz) 118.3 kg (260 lb 12.9 oz) 118.6 kg (261 lb 7.5 oz)    History of present illness:  This 47 year old man presented to the hospital with symptoms of abdominal pain. Please see initial history as outlined below: HPI: Stanley Thompson is a 47 y.o. male who presents to the emergency room with multiple complaints. He has noted worsening shortness of breath for the past several weeks. He does find some relief with his albuterol nebulizer, but since he is running low on medication, he has been rationing the medicine so that it will last longer. He has not noticed any change in his cough. He is unsure if she's had any fever. He recently quit smoking approximately one month ago. He is also complaining of right upper quadrant/lower quadrant abdominal pain. Patient reports that he drinks 5-6 shots of whiskey on a daily basis. He often becomes tremulous and undergoes withdrawal if he stops drinking. Abdominal pain associated with frequent vomiting. He also reports frequent bouts of diarrhea. Denies any hematemesis, hematochezia or melena. Patient also describes noticing that he's had worsening pedal edema over the past several weeks. He has used some of his wife's diuretic without any  significant improvement. On arrival to the emergency room, he was noted to have an elevated lipase consistent with acute pancreatitis. Transaminases were also elevated in a pattern of alcohol abuse. He was noted to be diffusely wheezing and received a breathing treatment. Patient has been referred to the hospital for further evaluation.  Hospital Course:  The patient was admitted and treated with intravenous fluids, n.p.o., analgesia as required and antiemetics as required. He made good improvement rapidly. He was found to have cholelithiasis on ultrasound and CT abdominal scanning. However, it was not felt that the cause of his pancreatitis was gallstones but more likely his alcohol abuse. He has been counseled about this. Certainly, there was no evidence of acute cholecystitis clinically or radiologically. He is tolerated a diet without vomiting and his abdominal pain is resolved. He is now stable for discharge. I've encouraged him to quit drinking alcohol and also to followup with his primary care physician regarding his cholelithiasis as he may need surgical referral.  Procedures:  None.   Consultations:  None.  Discharge Exam: Filed Vitals:   08/23/12 0131 08/23/12 0346 08/23/12 0655 08/23/12 0713  BP: 129/76  124/82   Pulse: 101  101   Temp: 98.3 F (36.8 C)  98.3 F (36.8 C)   TempSrc: Oral  Oral   Resp: 18  18   Height:      Weight:   118.6 kg (261 lb 7.5 oz)   SpO2: 100% 96% 98% 96%    General: He looks systemically well. He does not appear to be in pain. Cardiovascular: Heart sounds are present and regular without murmurs or added sounds. Respiratory: Lung fields are clear. Abdomen is  soft and nontender. He is alert and orientated.  Discharge Instructions  Discharge Orders   Future Orders Complete By Expires     Diet - low sodium heart healthy  As directed     Increase activity slowly  As directed         Medication List    STOP taking these medications        aspirin EC 81 MG tablet      TAKE these medications       albuterol (2.5 MG/3ML) 0.083% nebulizer solution  Commonly known as:  PROVENTIL  Take 3 mLs (2.5 mg total) by nebulization every 6 (six) hours as needed for wheezing. For shortness of breath/difficulty breathing     folic acid 1 MG tablet  Commonly known as:  FOLVITE  Take 1 tablet (1 mg total) by mouth daily.     thiamine 100 MG tablet  Take 1 tablet (100 mg total) by mouth daily.       Allergies  Allergen Reactions  . Advair Diskus (Fluticasone-Salmeterol) Swelling    throat       Follow-up Information   Follow up with Stanley Kicks, MD. Schedule an appointment as soon as possible for a visit in 1 week.   Contact information:   [address] [place] [address] [phone]        The results of significant diagnostics from this hospitalization (including imaging, microbiology, ancillary and laboratory) are listed below for reference.    Significant Diagnostic Studies: Dg Chest 2 View  08/19/2012   *RADIOLOGY REPORT*  Clinical Data: Shortness of breath  CHEST - 2 VIEW  Comparison: 11/21/2007  Findings: The heart size and mediastinal contours are within normal limits.  Both lungs are clear.  The visualized skeletal structures are unremarkable.  IMPRESSION: Negative exam.   Original Report Authenticated By: Signa Kell, M.D.   Ct Abdomen Pelvis W Contrast  08/19/2012   *RADIOLOGY REPORT*  Clinical Data: Right sided abdominal pain and vomiting  CT ABDOMEN AND PELVIS WITH CONTRAST  Technique:  Multidetector CT imaging of the abdomen and pelvis was performed following the standard protocol during bolus administration of intravenous contrast.  Contrast: 50mL OMNIPAQUE IOHEXOL 300 MG/ML  SOLN, OMNIPAQUE IOHEXOL 300 MG/ML  SOLN  Comparison: None.  Findings:  There is geographic segmental retraction involving primarily the anterior aspect of the right lobe the liver as well as the medial segment of the left  lobe of the liver.  There is diffuse decreased attenuation of the hepatic parenchyma suggestive of hepatic steatosis.  There are several laminated gallstones within the decompressed gallbladder.  No definite gallbladder wall thickening or pericholecystic fluid.  No definite intra or extrahepatic biliary duct dilatation.  No discrete hepatic lesion.  No definite ascites.  There is symmetric enhancement and excretion of the bilateral kidneys.  No definite renal stones on this post contrast examination.  No discrete renal lesions.  No urinary obstruction. There is grossly symmetric likely age related perinephric stranding.  Normal appearance of the bilateral adrenal glands, pancreas and spleen.  There is rather diffuse, nonspecific within the abdominal mesentery.  The colon is under distended but otherwise normal. Incidental note is made of lipomatous hypertrophy of the ileocecal valve.  Normal appearance of the appendix.  No pneumoperitoneum, pneumatosis or portal venous gas.  There is minimal atherosclerotic plaque within a normal caliber abdominal aorta.  The major branch vessels of the abdominal aorta appear patent on this non CT examination.  Shoddy  retroperitoneal and porta hepatis lymph nodes are not enlarged by C right criteria with index for hepatis node measuring approximately 9 mm in short axis diameter (image 34, series 2).  Normal appearance of the pelvic organs.  No free fluid within the pelvis.  Limited visualization of the lower thorax is negative for focal airspace opacity or pleural effusion.  Normal heart size.  No pericardial effusion.  No acute or aggressive osseous abnormalities.  Mild to moderate multilevel lumbar spine DDD, worse at L5 - S1 with disc space height loss, end plate irregularity and posterior disc osteophyte complex at this level.  IMPRESSION: 1.  Nonspecific rather diffuse stranding within the abdominal mesentery presumably the sequela of volume overload/third spacing. Chest  radiograph to evaluate for pulmonary edema may be performed as clinically indicated. 2.  No acute findings within the abdomen and pelvis, specifically, no evidence of enteric or urinary obstruction.  Normal appearance of the appendix. 3.  Cholelithiasis without evidence of cholecystitis. 4.  Hepatic steatosis with geographic atrophy of the liver adjacent to the gallbladder fossa but without discrete underlying hepatic lesion.  Further evaluation with abdominal MRI may be performed as clinically indicated.   Original Report Authenticated By: Tacey Ruiz, MD   US Abdomen Limited Ruq  08/20/2012   *RADIOLOGY REPORT*  Clinical Data:  Pancreatitis  LIMITED ABDOMINAL ULTRASOUND - RIGHT UPPER QUADRANT  Comparison:  CT 08/19/2012  Findings:  Gallbladder:  Gallbladder poorly visualized secondary to gallstones within the lumen.  Common bile duct:  Not well seen secondary to increased hepatic echogenicity.  Appears normal at 5 mm.  Liver:  The liver is increased in parenchymal echogenicity.  No ductal dilatation.  IMPRESSION:  1.  Cholelithiasis. 2.  Echogenic liver commonly represents hepatic steatosis.  Cannot exclude underlying hepatocellular disease. 3.  Exam is somewhat limited due to increased liver echogenicity.                    Original Report Authenticated By: Genevive Bi, M.D.        Labs: Basic Metabolic Panel:  Recent Labs Lab 08/19/12 1318 08/20/12 0552 08/21/12 0539 08/22/12 0441 08/23/12 0509  NA 135 136 132* 132* 135  K 2.4* 3.7 4.8 4.8 4.5  CL 87* 94* 94* 96 100  CO2 28 35* 30 29 27   GLUCOSE 130* 96 81 92 100*  BUN 6 6 4* 3* 3*  CREATININE 0.52 0.54 0.55 0.58 0.63  CALCIUM 8.3* 7.9* 8.0* 8.5 8.3*  MG 1.1*  --   --   --   --    Liver Function Tests:  Recent Labs Lab 08/19/12 1318 08/20/12 0552 08/21/12 0539 08/23/12 0509  AST 170* 145* 154* 97*  ALT 26 25 25 23   ALKPHOS 252* 241* 229* 199*  BILITOT 1.3* 1.6* 1.7* 0.9  PROT 6.2 6.4 6.0 5.6*  ALBUMIN 2.5* 2.6* 2.4*  2.2*    Recent Labs Lab 08/19/12 1318 08/20/12 0744 08/21/12 0539 08/22/12 0441  LIPASE 540* 449* 253* 299*    CBC:  Recent Labs Lab 08/19/12 1318 08/20/12 0552 08/21/12 0539 08/22/12 0441 08/23/12 0509  WBC 5.6 5.9 10.1 7.9 6.2  NEUTROABS 4.0  --   --   --   --   HGB 10.5* 10.6* 10.2* 10.8* 9.7*  HCT 30.3* 31.9* 30.5* 32.7* 29.9*  MCV 106.3* 108.9* 110.5* 110.1* 111.6*  PLT 184 181 181 215 230   Cardiac Enzymes:  Recent Labs Lab 08/19/12 1318  TROPONINI <0.30   BNP: BNP (  last 3 results)  Recent Labs  08/19/12 1318  PROBNP 43.1         Signed:  Danil Wedge C  [facility] 08/23/2012, 8:50 AM

## 2012-08-23 NOTE — Progress Notes (Signed)
Patient discharged home. IV removed - WNL.  Follow up appointments in place.  Instructed on new medications. Educated on pancreatitis and advised to stop smoking and using alcohol for prevention.  Patient verbalizes understanding of discharge instructions. Has no questions at this time.  PCP to refer to surgeon for gallbladder removal - info faxed to office.  Patient left floor in [facility] with help of NT in stable condition.

## 2012-08-23 NOTE — Care Management Note (Signed)
    Page 1 of 1   08/23/2012     10:38:41 AM   CARE MANAGEMENT NOTE 08/23/2012  Patient:  Stanley Thompson, Stanley Thompson   Account Number:  [id number]  Date Initiated:  08/22/2012  Documentation initiated by:  Anibal Henderson  Subjective/Objective Assessment:   Admitted with pancreatitis, ETOH abuse. Pt is from home, lives with spouse, and will return home at D/C. may need assistance with medication at D/C     Action/Plan:   Will follow for needs   Anticipated DC Date:  08/23/2012   Anticipated DC Plan:  HOME/SELF CARE      DC Planning Services  CM consult      Choice offered to / List presented to:             Status of service:  Completed, signed off Medicare Important Message given?   (If response is "NO", the following Medicare IM given date fields will be blank) Date Medicare IM given:   Date Additional Medicare IM given:    Discharge Disposition:  HOME/SELF CARE  Per UR Regulation:  Reviewed for med. necessity/level of care/duration of stay  If discussed at Long Length of Stay Meetings, dates discussed:    Comments:  08/23/12 1037 Arlyss Queen, RN BSN CM Pt discharged home today. Pt has a neb machine for home. Pt does not qualify for home O2. No other CM needs noted.  08/22/12 1100 Anibal Henderson RN/CM

## 2012-08-23 NOTE — Progress Notes (Signed)
UR chart review completed.  

## 2012-09-06 NOTE — Consult Note (Signed)
NAMEDEVUN, ANNA NO.:  [id number]  MEDICAL RECORD NO.:  [id number]  LOCATION:  A317                          FACILITY:  [facility]  PHYSICIAN:  Barbaraann Barthel, M.D. DATE OF BIRTH:  04/12/1965  DATE OF CONSULTATION: DATE OF DISCHARGE:  08/23/2012                                CONSULTATION   Dear Dr. Reinaldo Berber and Dr. Karilyn Cota:  I saw Mr. Sipos in my office on September 05, 2012, at which time I reviewed his recent hospitalization records and performed a history and physical.  In essence, his abdomen is soft and quiescent after a bout of pancreatitis secondary to alcoholic abuse.  We will plan for elective surgery for laparoscopic cholecystectomy for his cholelithiasis.  At present, he has some pedal edema bilaterally and he is going to see his medical doctor and be cleared from their point of view for surgery and he has also promised me that he is going to continue to stop drinking and stop smoking.  As stated, his abdomen was completely soft and we again prescribed a restrictive diet to him and will plan for the surgery in a couple of weeks.  We discussed the surgery with him in detail and his wife and informed consent was obtained and we will plan to do this at a time that is convenient for him.     Barbaraann Barthel, M.D.     WB/MEDQ  D:  09/05/2012  T:  09/06/2012  Job:  [id number]  cc:   Calla Kicks, M.D. [facility]

## 2012-09-28 ENCOUNTER — Encounter (HOSPITAL_COMMUNITY): Payer: Self-pay | Admitting: Pharmacy Technician

## 2012-10-03 ENCOUNTER — Encounter (HOSPITAL_COMMUNITY)
Admission: RE | Admit: 2012-10-03 | Discharge: 2012-10-03 | Disposition: A | Discharge status: Home / Self Care | Payer: Self-pay | Source: Ambulatory Visit | Attending: General Surgery | Admitting: General Surgery

## 2012-10-03 ENCOUNTER — Encounter (HOSPITAL_COMMUNITY): Payer: Self-pay

## 2012-10-03 DIAGNOSIS — Z01812 Encounter for preprocedural laboratory examination: Secondary | ICD-10-CM | POA: Insufficient documentation

## 2012-10-03 DIAGNOSIS — I10 Essential (primary) hypertension: Secondary | ICD-10-CM | POA: Insufficient documentation

## 2012-10-03 HISTORY — DX: Chronic obstructive pulmonary disease, unspecified: J44.9

## 2012-10-03 HISTORY — DX: Shortness of breath: R06.02

## 2012-10-03 HISTORY — DX: Alcohol induced acute pancreatitis without necrosis or infection: K85.20

## 2012-10-03 LAB — CBC
Platelets: 209 10*3/uL (ref 150–400)
RBC: 4.07 MIL/uL — ABNORMAL LOW (ref 4.22–5.81)
RDW: 16.2 % — ABNORMAL HIGH (ref 11.5–15.5)
WBC: 5.2 10*3/uL (ref 4.0–10.5)

## 2012-10-03 LAB — BASIC METABOLIC PANEL
CO2: 27 mEq/L (ref 19–32)
Chloride: 105 mEq/L (ref 96–112)
GFR calc Af Amer: >90 mL/min (ref >90)
Sodium: 141 mEq/L (ref 135–145)

## 2012-10-03 NOTE — Patient Instructions (Signed)
Stanley Thompson  10/03/2012   Your procedure is scheduled on:  10/07/12  Report to [facility] at 06:15 AM.  Call this number if you have problems the morning of surgery: [phone]   Remember:   Do not eat food or drink liquids after midnight.   Take these medicines the morning of surgery: Albuterol and Combivent   Do not wear jewelry, make-up or nail polish.  Do not wear lotions, powders, or perfumes.   Do not shave 48 hours prior to surgery. Men may shave face and neck.  Do not bring valuables to the hospital.  [facility] is not responsible for any belongings or valuables.  Contacts, dentures or bridgework may not be worn into surgery.  Leave suitcase in the car. After surgery it may be brought to your room.     Patients discharged the day of surgery will not be allowed to drive home.   Special Instructions: Shower using CHG 2 nights before surgery and the night before surgery.  If you shower the day of surgery use CHG.  Use special wash - you have one bottle of CHG for all showers.  You should use approximately 1/3 of the bottle for each shower.   Please read over the following fact sheets that you were given: Pain Booklet, Surgical Site Infection Prevention, Anesthesia Post-op Instructions and Care and Recovery After Surgery    Laparoscopic Cholecystectomy Laparoscopic cholecystectomy is surgery to remove the gallbladder. The gallbladder is located slightly to the right of center in the abdomen, behind the liver. It is a concentrating and storage sac for the bile produced in the liver. Bile aids in the digestion and absorption of fats. Gallbladder disease (cholecystitis) is an inflammation of your gallbladder. This condition is usually caused by a buildup of gallstones (cholelithiasis) in your gallbladder. Gallstones can block the flow of bile, resulting in inflammation and pain. In severe cases, emergency surgery may be required. When emergency surgery is not required, you will have  time to prepare for the procedure. Laparoscopic surgery is an alternative to open surgery. Laparoscopic surgery usually has a shorter recovery time. Your common bile duct may also need to be examined and explored. Your caregiver will discuss this with you if he or she feels this should be done. If stones are found in the common bile duct, they may be removed. LET YOUR CAREGIVER KNOW ABOUT:  Allergies to food or medicine.  Medicines taken, including vitamins, herbs, eyedrops, over-the-counter medicines, and creams.  Use of steroids (by mouth or creams).  Previous problems with anesthetics or numbing medicines.  History of bleeding problems or blood clots.  Previous surgery.  Other health problems, including diabetes and kidney problems.  Possibility of pregnancy, if this applies. RISKS AND COMPLICATIONS All surgery is associated with risks. Some problems that may occur following this procedure include:  Infection.  Damage to the common bile duct, nerves, arteries, veins, or other internal organs such as the stomach or intestines.  Bleeding.  A stone may remain in the common bile duct. BEFORE THE PROCEDURE  Do not take aspirin for 3 days prior to surgery or blood thinners for 1 week prior to surgery.  Do not eat or drink anything after midnight the night before surgery.  Let your caregiver know if you develop a cold or other infectious problem prior to surgery.  You should be present 60 minutes before the procedure or as directed. PROCEDURE  You will be given medicine that makes you sleep (general  anesthetic). When you are asleep, your surgeon will make several small cuts (incisions) in your abdomen. One of these incisions is used to insert a small, lighted scope (laparoscope) into the abdomen. The laparoscope helps the surgeon see into your abdomen. Carbon dioxide gas will be pumped into your abdomen. The gas allows more room for the surgeon to perform your surgery. Other  operating instruments are inserted through the other incisions. Laparoscopic procedures may not be appropriate when:  There is major scarring from previous surgery.  The gallbladder is extremely inflamed.  There are bleeding disorders or unexpected cirrhosis of the liver.  A pregnancy is near term.  Other conditions make the laparoscopic procedure impossible. If your surgeon feels it is not safe to continue with a laparoscopic procedure, he or she will perform an open abdominal procedure. In this case, the surgeon will make an incision to open the abdomen. This gives the surgeon a larger view and field to work within. This may allow the surgeon to perform procedures that sometimes cannot be performed with a laparoscope alone. Open surgery has a longer recovery time. AFTER THE PROCEDURE  You will be taken to the recovery area where a nurse will watch and check your progress.  You may be allowed to go home the same day.  Do not resume physical activities until directed by your caregiver.  You may resume a normal diet and activities as directed. Document Released: 02/16/2005 Document Revised: 05/11/2011 Document Reviewed: 08/01/2010 [facility] Patient Information 2014 [place], [place].    PATIENT INSTRUCTIONS POST-ANESTHESIA  IMMEDIATELY FOLLOWING SURGERY:  Do not drive or operate machinery for the first twenty four hours after surgery.  Do not make any important decisions for twenty four hours after surgery or while taking narcotic pain medications or sedatives.  If you develop intractable nausea and vomiting or a severe headache please notify your doctor immediately.  FOLLOW-UP:  Please make an appointment with your surgeon as instructed. You do not need to follow up with anesthesia unless specifically instructed to do so.  WOUND CARE INSTRUCTIONS (if applicable):  Keep a dry clean dressing on the anesthesia/puncture wound site if there is drainage.  Once the wound has quit draining you  may leave it open to air.  Generally you should leave the bandage intact for twenty four hours unless there is drainage.  If the epidural site drains for more than 36-48 hours please call the anesthesia department.  QUESTIONS?:  Please feel free to call your physician or the hospital operator if you have any questions, and they will be happy to assist you.
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
## 2012-10-05 LAB — HEPATIC FUNCTION PANEL
ALT: 12 U/L (ref 0–53)
Albumin: 3.1 g/dL — ABNORMAL LOW (ref 3.5–5.2)
Alkaline Phosphatase: 66 U/L (ref 39–117)
Total Protein: 6.4 g/dL (ref 6.0–8.3)

## 2012-10-05 LAB — AMYLASE: Amylase: 36 U/L (ref 0–105)

## 2012-10-07 ENCOUNTER — Encounter (HOSPITAL_COMMUNITY)
Admission: RE | Disposition: A | Discharge status: Home / Self Care | Payer: Self-pay | Source: Ambulatory Visit | Attending: Internal Medicine

## 2012-10-07 ENCOUNTER — Encounter (HOSPITAL_COMMUNITY): Payer: Self-pay | Admitting: Anesthesiology

## 2012-10-07 ENCOUNTER — Ambulatory Visit (HOSPITAL_COMMUNITY): Payer: Self-pay | Admitting: Anesthesiology

## 2012-10-07 ENCOUNTER — Inpatient Hospital Stay (HOSPITAL_COMMUNITY)
Admission: RE | Admit: 2012-10-07 | Discharge: 2012-10-14 | DRG: 417 | Disposition: A | Discharge status: Home / Self Care | Payer: Self-pay | Source: Ambulatory Visit | Attending: Internal Medicine | Admitting: Internal Medicine

## 2012-10-07 ENCOUNTER — Encounter (HOSPITAL_COMMUNITY): Payer: Self-pay | Admitting: *Deleted

## 2012-10-07 DIAGNOSIS — J449 Chronic obstructive pulmonary disease, unspecified: Secondary | ICD-10-CM

## 2012-10-07 DIAGNOSIS — D649 Anemia, unspecified: Secondary | ICD-10-CM | POA: Diagnosis present

## 2012-10-07 DIAGNOSIS — K8020 Calculus of gallbladder without cholecystitis without obstruction: Secondary | ICD-10-CM

## 2012-10-07 DIAGNOSIS — I2699 Other pulmonary embolism without acute cor pulmonale: Secondary | ICD-10-CM | POA: Diagnosis not present

## 2012-10-07 DIAGNOSIS — K8010 Calculus of gallbladder with chronic cholecystitis without obstruction: Principal | ICD-10-CM | POA: Diagnosis present

## 2012-10-07 DIAGNOSIS — E8809 Other disorders of plasma-protein metabolism, not elsewhere classified: Secondary | ICD-10-CM

## 2012-10-07 DIAGNOSIS — J95821 Acute postprocedural respiratory failure: Secondary | ICD-10-CM | POA: Diagnosis not present

## 2012-10-07 DIAGNOSIS — E876 Hypokalemia: Secondary | ICD-10-CM | POA: Diagnosis present

## 2012-10-07 DIAGNOSIS — E669 Obesity, unspecified: Secondary | ICD-10-CM | POA: Diagnosis present

## 2012-10-07 DIAGNOSIS — F17200 Nicotine dependence, unspecified, uncomplicated: Secondary | ICD-10-CM | POA: Diagnosis present

## 2012-10-07 DIAGNOSIS — F1010 Alcohol abuse, uncomplicated: Secondary | ICD-10-CM

## 2012-10-07 DIAGNOSIS — I10 Essential (primary) hypertension: Secondary | ICD-10-CM

## 2012-10-07 DIAGNOSIS — Y836 Removal of other organ (partial) (total) as the cause of abnormal reaction of the patient, or of later complication, without mention of misadventure at the time of the procedure: Secondary | ICD-10-CM | POA: Diagnosis not present

## 2012-10-07 DIAGNOSIS — Z5331 Laparoscopic surgical procedure converted to open procedure: Secondary | ICD-10-CM

## 2012-10-07 DIAGNOSIS — J4489 Other specified chronic obstructive pulmonary disease: Secondary | ICD-10-CM | POA: Diagnosis present

## 2012-10-07 DIAGNOSIS — K859 Acute pancreatitis without necrosis or infection, unspecified: Secondary | ICD-10-CM

## 2012-10-07 DIAGNOSIS — J189 Pneumonia, unspecified organism: Secondary | ICD-10-CM | POA: Diagnosis not present

## 2012-10-07 DIAGNOSIS — J9601 Acute respiratory failure with hypoxia: Secondary | ICD-10-CM | POA: Diagnosis not present

## 2012-10-07 HISTORY — PX: CHOLECYSTECTOMY: SHX55

## 2012-10-07 SURGERY — LAPAROSCOPIC CHOLECYSTECTOMY
Anesthesia: General | Site: Abdomen | Wound class: Contaminated

## 2012-10-07 MED ORDER — FENTANYL CITRATE 0.05 MG/ML IJ SOLN
INTRAMUSCULAR | Status: AC
  Filled 2012-10-07: qty 2

## 2012-10-07 MED ORDER — BUPIVACAINE HCL (PF) 0.5 % IJ SOLN
INTRAMUSCULAR | Status: DC | PRN
  Administered 2012-10-07: 4 mL
  Administered 2012-10-07: 18 mL

## 2012-10-07 MED ORDER — LIDOCAINE HCL 1 % IJ SOLN
INTRAMUSCULAR | Status: DC | PRN
  Administered 2012-10-07: 50 mg via INTRADERMAL

## 2012-10-07 MED ORDER — FENTANYL CITRATE 0.05 MG/ML IJ SOLN
INTRAMUSCULAR | Status: DC | PRN
  Administered 2012-10-07: 100 ug via INTRAVENOUS
  Administered 2012-10-07: 50 ug via INTRAVENOUS
  Administered 2012-10-07 (×3): 100 ug via INTRAVENOUS
  Administered 2012-10-07 (×2): 50 ug via INTRAVENOUS
  Administered 2012-10-07: 100 ug via INTRAVENOUS
  Administered 2012-10-07 (×3): 50 ug via INTRAVENOUS

## 2012-10-07 MED ORDER — DEXTROSE 5 % IV SOLN: 2.0000 g | INTRAVENOUS | Status: DC

## 2012-10-07 MED ORDER — FENTANYL CITRATE 0.05 MG/ML IJ SOLN
INTRAMUSCULAR | Status: AC
  Filled 2012-10-07: qty 5

## 2012-10-07 MED ORDER — FENTANYL CITRATE 0.05 MG/ML IJ SOLN
25.0000 ug | INTRAMUSCULAR | Status: DC | PRN
  Administered 2012-10-07 (×4): 50 ug via INTRAVENOUS

## 2012-10-07 MED ORDER — ROCURONIUM BROMIDE 50 MG/5ML IV SOLN
INTRAVENOUS | Status: AC
  Filled 2012-10-07: qty 1

## 2012-10-07 MED ORDER — IPRATROPIUM-ALBUTEROL 18-103 MCG/ACT IN AERO
2.0000 | INHALATION_SPRAY | Freq: Four times a day (QID) | RESPIRATORY_TRACT | Status: DC | PRN
Start: 2012-10-07 — End: 2012-10-07

## 2012-10-07 MED ORDER — DEXTROSE 5 % IV SOLN
1.0000 g | INTRAVENOUS | Status: AC
  Administered 2012-10-08: 1 g via INTRAVENOUS
  Filled 2012-10-07: qty 10

## 2012-10-07 MED ORDER — MIDAZOLAM HCL 2 MG/2ML IJ SOLN
INTRAMUSCULAR | Status: AC
  Filled 2012-10-07: qty 2

## 2012-10-07 MED ORDER — ONDANSETRON HCL 4 MG/2ML IJ SOLN
INTRAMUSCULAR | Status: AC
  Filled 2012-10-07: qty 2

## 2012-10-07 MED ORDER — POTASSIUM CHLORIDE IN NACL 20-0.9 MEQ/L-% IV SOLN
INTRAVENOUS | Status: DC
  Administered 2012-10-07 – 2012-10-10 (×5): via INTRAVENOUS
  Administered 2012-10-12: 10 mL/h via INTRAVENOUS

## 2012-10-07 MED ORDER — NEOSTIGMINE METHYLSULFATE 1 MG/ML IJ SOLN
INTRAMUSCULAR | Status: AC
  Filled 2012-10-07: qty 1

## 2012-10-07 MED ORDER — DEXTROSE 5 % IV SOLN
INTRAVENOUS | Status: AC
  Filled 2012-10-07: qty 2

## 2012-10-07 MED ORDER — BUPIVACAINE HCL (PF) 0.5 % IJ SOLN
INTRAMUSCULAR | Status: AC
  Filled 2012-10-07: qty 30

## 2012-10-07 MED ORDER — LIDOCAINE HCL (PF) 1 % IJ SOLN
INTRAMUSCULAR | Status: AC
  Filled 2012-10-07: qty 5

## 2012-10-07 MED ORDER — HYDROMORPHONE HCL PF 1 MG/ML IJ SOLN
INTRAMUSCULAR | Status: AC
  Filled 2012-10-07: qty 1

## 2012-10-07 MED ORDER — PROPOFOL 10 MG/ML IV EMUL
INTRAVENOUS | Status: AC
  Filled 2012-10-07: qty 20

## 2012-10-07 MED ORDER — MIDAZOLAM HCL 2 MG/2ML IJ SOLN
1.0000 mg | INTRAMUSCULAR | Status: DC | PRN
  Administered 2012-10-07: 2 mg via INTRAVENOUS

## 2012-10-07 MED ORDER — FUROSEMIDE 20 MG PO TABS
20.0000 mg | ORAL_TABLET | Freq: Every day | ORAL | Status: DC
  Administered 2012-10-08 – 2012-10-14 (×7): 20 mg via ORAL
  Filled 2012-10-07 (×7): qty 1

## 2012-10-07 MED ORDER — NEOSTIGMINE METHYLSULFATE 1 MG/ML IJ SOLN
INTRAMUSCULAR | Status: DC | PRN
  Administered 2012-10-07 (×3): 1 mg via INTRAVENOUS

## 2012-10-07 MED ORDER — HYDROMORPHONE HCL PF 1 MG/ML IJ SOLN
0.2500 mg | INTRAMUSCULAR | Status: DC | PRN
  Administered 2012-10-07 (×2): 0.5 mg via INTRAVENOUS

## 2012-10-07 MED ORDER — GLYCOPYRROLATE 0.2 MG/ML IJ SOLN
0.2000 mg | Freq: Once | INTRAMUSCULAR | Status: AC
  Administered 2012-10-07: 0.2 mg via INTRAVENOUS

## 2012-10-07 MED ORDER — LACTATED RINGERS IV SOLN
INTRAVENOUS | Status: DC
  Administered 2012-10-07 (×3): via INTRAVENOUS

## 2012-10-07 MED ORDER — GLYCOPYRROLATE 0.2 MG/ML IJ SOLN
INTRAMUSCULAR | Status: AC
  Filled 2012-10-07: qty 1

## 2012-10-07 MED ORDER — 0.9 % SODIUM CHLORIDE (POUR BTL) OPTIME
TOPICAL | Status: DC | PRN
  Administered 2012-10-07 (×2): 1000 mL

## 2012-10-07 MED ORDER — ONDANSETRON HCL 4 MG/2ML IJ SOLN: 4.0000 mg | Freq: Four times a day (QID) | INTRAMUSCULAR | Status: DC | PRN

## 2012-10-07 MED ORDER — WATER FOR IRRIGATION, STERILE IR SOLN
Status: DC | PRN
  Administered 2012-10-07 (×2): 1000 mL

## 2012-10-07 MED ORDER — ROCURONIUM BROMIDE 100 MG/10ML IV SOLN
INTRAVENOUS | Status: DC | PRN
  Administered 2012-10-07: 40 mg via INTRAVENOUS
  Administered 2012-10-07 (×2): 10 mg via INTRAVENOUS
  Administered 2012-10-07: 20 mg via INTRAVENOUS

## 2012-10-07 MED ORDER — MIDAZOLAM HCL 5 MG/5ML IJ SOLN
INTRAMUSCULAR | Status: DC | PRN
  Administered 2012-10-07: 2 mg via INTRAVENOUS

## 2012-10-07 MED ORDER — GLYCOPYRROLATE 0.2 MG/ML IJ SOLN
INTRAMUSCULAR | Status: DC | PRN
  Administered 2012-10-07: 0.2 mg via INTRAVENOUS

## 2012-10-07 MED ORDER — DEXTROSE 5 % IV SOLN
1.0000 g | Freq: Once | INTRAVENOUS | Status: DC
  Administered 2012-10-07: 2 g via INTRAVENOUS

## 2012-10-07 MED ORDER — MORPHINE SULFATE 2 MG/ML IJ SOLN
2.0000 mg | INTRAMUSCULAR | Status: DC | PRN
  Administered 2012-10-07 – 2012-10-09 (×9): 2 mg via INTRAVENOUS
  Filled 2012-10-07 (×9): qty 1

## 2012-10-07 MED ORDER — ONDANSETRON HCL 4 MG/2ML IJ SOLN
4.0000 mg | Freq: Once | INTRAMUSCULAR | Status: AC
  Administered 2012-10-07: 4 mg via INTRAVENOUS

## 2012-10-07 MED ORDER — HEMOSTATIC AGENTS (NO CHARGE) OPTIME
TOPICAL | Status: DC | PRN
  Administered 2012-10-07: 1 via TOPICAL

## 2012-10-07 MED ORDER — ONDANSETRON HCL 4 MG PO TABS: 4.0000 mg | ORAL_TABLET | Freq: Four times a day (QID) | ORAL | Status: DC | PRN

## 2012-10-07 MED ORDER — BIOTENE DRY MOUTH MT LIQD
15.0000 mL | Freq: Two times a day (BID) | OROMUCOSAL | Status: DC
  Administered 2012-10-07 – 2012-10-14 (×12): 15 mL via OROMUCOSAL

## 2012-10-07 MED ORDER — PROPOFOL 10 MG/ML IV BOLUS
INTRAVENOUS | Status: DC | PRN
  Administered 2012-10-07: 170 mg via INTRAVENOUS

## 2012-10-07 MED ORDER — SUCCINYLCHOLINE CHLORIDE 20 MG/ML IJ SOLN
INTRAMUSCULAR | Status: DC | PRN
  Administered 2012-10-07: 180 mg via INTRAVENOUS

## 2012-10-07 MED ORDER — FENTANYL CITRATE 0.05 MG/ML IJ SOLN: 25.0000 ug | INTRAMUSCULAR | Status: DC

## 2012-10-07 MED ORDER — ONDANSETRON HCL 4 MG/2ML IJ SOLN: 4.0000 mg | Freq: Once | INTRAMUSCULAR | Status: DC | PRN

## 2012-10-07 MED ORDER — SODIUM CHLORIDE 0.9 % IR SOLN
Status: DC | PRN
  Administered 2012-10-07: 3000 mL

## 2012-10-07 MED ORDER — ALBUTEROL SULFATE (5 MG/ML) 0.5% IN NEBU
2.5000 mg | INHALATION_SOLUTION | Freq: Four times a day (QID) | RESPIRATORY_TRACT | Status: DC | PRN
  Administered 2012-10-07 – 2012-10-09 (×8): 2.5 mg via RESPIRATORY_TRACT
  Filled 2012-10-07 (×8): qty 0.5

## 2012-10-07 MED ORDER — IPRATROPIUM-ALBUTEROL 20-100 MCG/ACT IN AERS
1.0000 | INHALATION_SPRAY | Freq: Four times a day (QID) | RESPIRATORY_TRACT | Status: DC | PRN
  Filled 2012-10-07: qty 4

## 2012-10-07 MED ORDER — 0.9 % SODIUM CHLORIDE (POUR BTL) OPTIME
TOPICAL | Status: DC | PRN
  Administered 2012-10-07: 1000 mL

## 2012-10-07 SURGICAL SUPPLY — 72 items
APPLICATOR COTTON TIP 6IN STRL (MISCELLANEOUS) ×4 IMPLANT
APPLIER CLIP 11 MED OPEN (CLIP) ×2
APPLIER CLIP 13 LRG OPEN (CLIP) ×2
APPLIER CLIP LAPSCP 10X32 DD (CLIP) ×2 IMPLANT
ATTRACTOMAT 16X20 MAGNETIC DRP (DRAPES) ×2 IMPLANT
BAG HAMPER (MISCELLANEOUS) ×2 IMPLANT
BLADE SURG 15 STRL LF DISP TIS (BLADE) ×1 IMPLANT
BLADE SURG 15 STRL SS (BLADE) ×1
BLADE SURG SZ10 CARB STEEL (BLADE) ×4 IMPLANT
CLEANER TIP ELECTROSURG 2X2 (MISCELLANEOUS) ×2 IMPLANT
CLIP APPLIE 11 MED OPEN (CLIP) ×1 IMPLANT
CLIP APPLIE 13 LRG OPEN (CLIP) ×1 IMPLANT
CLOTH BEACON ORANGE TIMEOUT ST (SAFETY) ×2 IMPLANT
COVER LIGHT HANDLE STERIS (MISCELLANEOUS) ×4 IMPLANT
DECANTER SPIKE VIAL GLASS SM (MISCELLANEOUS) ×2 IMPLANT
DISSECTOR BLUNT TIP ENDO 5MM (MISCELLANEOUS) ×2 IMPLANT
DRAPE WARM FLUID 44X44 (DRAPE) ×2 IMPLANT
DRSG TEGADERM 2-3/8X2-3/4 SM (GAUZE/BANDAGES/DRESSINGS) ×4 IMPLANT
ELECT BLADE 6 FLAT ULTRCLN (ELECTRODE) ×2 IMPLANT
ELECT REM PT RETURN 9FT ADLT (ELECTROSURGICAL) ×2
ELECTRODE REM PT RTRN 9FT ADLT (ELECTROSURGICAL) ×1 IMPLANT
EVACUATOR DRAINAGE 10X20 100CC (DRAIN) ×1 IMPLANT
EVACUATOR SILICONE 100CC (DRAIN) ×1
FILTER SMOKE EVAC LAPAROSHD (FILTER) ×2 IMPLANT
FORMALIN 10 PREFIL 120ML (MISCELLANEOUS) ×2 IMPLANT
GLOVE BIOGEL PI IND STRL 7.0 (GLOVE) ×3 IMPLANT
GLOVE BIOGEL PI INDICATOR 7.0 (GLOVE) ×3
GLOVE ECLIPSE 6.5 STRL STRAW (GLOVE) ×4 IMPLANT
GLOVE ECLIPSE 7.0 STRL STRAW (GLOVE) ×4 IMPLANT
GLOVE EXAM NITRILE MD LF STRL (GLOVE) ×2 IMPLANT
GLOVE SKINSENSE NS SZ7.0 (GLOVE) ×1
GLOVE SKINSENSE STRL SZ7.0 (GLOVE) ×1 IMPLANT
GOWN STRL REIN XL XLG (GOWN DISPOSABLE) ×8 IMPLANT
HEMOSTAT SURGICEL 4X8 (HEMOSTASIS) ×2 IMPLANT
INST SET LAPROSCOPIC AP (KITS) ×2 IMPLANT
INST SET MAJOR GENERAL (KITS) ×2 IMPLANT
IV NS IRRIG 3000ML ARTHROMATIC (IV SOLUTION) ×2 IMPLANT
KIT ROOM TURNOVER APOR (KITS) ×2 IMPLANT
MANIFOLD NEPTUNE II (INSTRUMENTS) ×2 IMPLANT
NS IRRIG 1000ML POUR BTL (IV SOLUTION) ×8 IMPLANT
PACK LAP CHOLE LZT030E (CUSTOM PROCEDURE TRAY) ×2 IMPLANT
PAD ARMBOARD 7.5X6 YLW CONV (MISCELLANEOUS) ×2 IMPLANT
PENCIL HANDSWITCHING (ELECTRODE) ×2 IMPLANT
POUCH SPECIMEN RETRIEVAL 10MM (ENDOMECHANICALS) ×2 IMPLANT
SET BASIN LINEN APH (SET/KITS/TRAYS/PACK) ×2 IMPLANT
SET TUBE IRRIG SUCTION NO TIP (IRRIGATION / IRRIGATOR) ×2 IMPLANT
SOL PREP PROV IODINE SCRUB 4OZ (MISCELLANEOUS) ×2 IMPLANT
SPONGE DRAIN TRACH 4X4 STRL 2S (GAUZE/BANDAGES/DRESSINGS) ×2 IMPLANT
SPONGE GAUZE 4X4 12PLY (GAUZE/BANDAGES/DRESSINGS) ×4 IMPLANT
SPONGE INTESTINAL PEANUT (DISPOSABLE) ×6 IMPLANT
SPONGE LAP 18X18 X RAY DECT (DISPOSABLE) ×4 IMPLANT
STAPLER VISISTAT 35W (STAPLE) ×2 IMPLANT
SUT ETHILON 3 0 FSL (SUTURE) ×2 IMPLANT
SUT SILK 2 0 (SUTURE) ×1
SUT SILK 2-0 18XBRD TIE 12 (SUTURE) ×1 IMPLANT
SUT SILK 3 0 SH CR/8 (SUTURE) ×2 IMPLANT
SUT VIC AB 0 CT1 27 (SUTURE) ×3
SUT VIC AB 0 CT1 27XBRD ANTBC (SUTURE) ×1 IMPLANT
SUT VIC AB 0 CT1 27XCR 8 STRN (SUTURE) ×2 IMPLANT
SUT VICRYL 0 UR6 27IN ABS (SUTURE) ×4 IMPLANT
SYR BULB IRRIGATION 50ML (SYRINGE) ×2 IMPLANT
TAPE CLOTH SURG 4X10 WHT LF (GAUZE/BANDAGES/DRESSINGS) ×2 IMPLANT
TOWEL OR 17X26 4PK STRL BLUE (TOWEL DISPOSABLE) IMPLANT
TRAY FOLEY CATH 16FR SILVER (SET/KITS/TRAYS/PACK) ×2 IMPLANT
TROCAR Z-THRD FIOS HNDL 11X100 (TROCAR) ×2 IMPLANT
TROCAR Z-THREAD FIOS 11X100 BL (TROCAR) ×2 IMPLANT
TROCAR Z-THREAD FIOS 5X100MM (TROCAR) ×2 IMPLANT
TROCAR Z-THREAD OPTICAL 5X100M (TROCAR) ×2 IMPLANT
TUBING INSUF HEATED (TUBING) ×2 IMPLANT
WARMER LAPAROSCOPE (MISCELLANEOUS) ×2 IMPLANT
WATER STERILE IRR 1000ML POUR (IV SOLUTION) ×4 IMPLANT
YANKAUER SUCT BULB TIP 10FT TU (MISCELLANEOUS) ×4 IMPLANT

## 2012-10-07 NOTE — Anesthesia Procedure Notes (Signed)
Procedure Name: Intubation Date/Time: 10/07/2012 7:55 AM Performed by: Despina Hidden Pre-anesthesia Checklist: Emergency Drugs available, Suction available, Patient being monitored and Patient identified Patient Re-evaluated:Patient Re-evaluated prior to inductionOxygen Delivery Method: Circle system utilized Preoxygenation: Pre-oxygenation with 100% oxygen Intubation Type: IV induction, Rapid sequence and Cricoid Pressure applied Ventilation: Mask ventilation without difficulty Laryngoscope Size: 3 and Mac Grade View: Grade II Tube type: Oral Tube size: 8.0 mm Number of attempts: 1 Airway Equipment and Method: Stylet Placement Confirmation: ETT inserted through vocal cords under direct vision,  positive ETCO2 and breath sounds checked- equal and bilateral Secured at: 22 cm Tube secured with: Tape Dental Injury: Teeth and Oropharynx as per pre-operative assessment

## 2012-10-07 NOTE — Anesthesia Postprocedure Evaluation (Signed)
  Anesthesia Post-op Note  Patient: Stanley Thompson  Procedure(s) Performed: Procedure(s) with comments: ATTEMPTED LAPAROSCOPIC CHOLECYSTECTOMY (N/A) - decision to open at [phone]; converted to open start at 0852 CHOLECYSTECTOMY (N/A) - converted to open; start at 0852  Patient Location: PACU  Anesthesia Type:General  Level of Consciousness: awake, alert , oriented and patient cooperative  Airway and Oxygen Therapy: Patient Spontanous Breathing  Post-op Pain: 4 /10, moderate  Post-op Assessment: Post-op Vital signs reviewed, Patient's Cardiovascular Status Stable, Respiratory Function Stable, Patent Airway, No signs of Nausea or vomiting and Pain level controlled  Post-op Vital Signs: Reviewed and stable  Complications: No apparent anesthesia complications

## 2012-10-07 NOTE — Brief Op Note (Signed)
10/07/2012  10:55 AM  PATIENT:  Stanley Thompson  47 y.o. male  PRE-OPERATIVE DIAGNOSIS:  cholelithiasis  POST-OPERATIVE DIAGNOSIS:  cholelithiasis  PROCEDURE:  Procedure(s) with comments: ATTEMPTED LAPAROSCOPIC CHOLECYSTECTOMY (N/A) - decision to open at 0843; converted to open start at 0852 CHOLECYSTECTOMY (N/A) - converted to open; start at 0852  SURGEON:  Surgeon(s) and Role:    * Marlane Hatcher, MD - Primary  PHYSICIAN ASSISTANT:   ASSISTANTS: none   ANESTHESIA:   general  EBL:  Total I/O In: 2200 [I.V.:2200] Out: 425 [Urine:100; Other:75; Blood:250]  BLOOD ADMINISTERED:none  DRAINS: (liver bed.) Jackson-Pratt drain(s) with closed bulb suction in the liver bed   LOCAL MEDICATIONS USED:  MARCAINE  0.5% 10 cc.   SPECIMEN:  Source of Specimen:  gall bladder and stones.  DISPOSITION OF SPECIMEN:  PATHOLOGY  COUNTS:  YES  TOURNIQUET:  * No tourniquets in log *  DICTATION: .Other Dictation: Dictation Number OR dictation # [id number].  PLAN OF CARE: Admit to inpatient   PATIENT DISPOSITION:  PACU - hemodynamically stable.   Delay start of Pharmacological VTE agent (>24hrs) due to surgical blood loss or risk of bleeding: not applicable

## 2012-10-07 NOTE — Addendum Note (Signed)
Addendum created 10/07/12 1158 by Despina Hidden, CRNA   Modules edited: Anesthesia LDA

## 2012-10-07 NOTE — Anesthesia Preprocedure Evaluation (Signed)
Anesthesia Evaluation  Patient identified by MRN, date of birth, ID band Patient awake    Reviewed: Allergy & Precautions, H&P , NPO status , Patient's Chart, lab work & pertinent test results  Airway Mallampati: III TM Distance: >3 FB Neck ROM: Full  Mouth opening: Limited Mouth Opening  Dental  (+) Teeth Intact   Pulmonary shortness of breath, asthma , COPDCurrent Smoker,  breath sounds clear to auscultation        Cardiovascular hypertension, Pt. on medications Rhythm:Regular Rate:Normal     Neuro/Psych    GI/Hepatic (+)     substance abuse  alcohol use, ETOH pancreatitis   Endo/Other    Renal/GU      Musculoskeletal   Abdominal   Peds  Hematology   Anesthesia Other Findings   Reproductive/Obstetrics                           Anesthesia Physical Anesthesia Plan  ASA: III  Anesthesia Plan: General/Spinal   Post-op Pain Management:    Induction: Intravenous, Rapid sequence and Cricoid pressure planned  Airway Management Planned: Oral ETT and Video Laryngoscope Planned  Additional Equipment:   Intra-op Plan:   Post-operative Plan: Extubation in OR  Informed Consent: I have reviewed the patients History and Physical, chart, labs and discussed the procedure including the risks, benefits and alternatives for the proposed anesthesia with the patient or authorized representative who has indicated his/her understanding and acceptance.     Plan Discussed with:   Anesthesia Plan Comments:         Anesthesia Quick Evaluation

## 2012-10-07 NOTE — Transfer of Care (Signed)
Immediate Anesthesia Transfer of Care Note  Patient: Stanley Thompson  Procedure(s) Performed: Procedure(s) with comments: ATTEMPTED LAPAROSCOPIC CHOLECYSTECTOMY (N/A) - decision to open at 0843; converted to open start at 0852 CHOLECYSTECTOMY (N/A) - converted to open; start at 0852  Patient Location: PACU  Anesthesia Type:General  Level of Consciousness: awake and patient cooperative  Airway & Oxygen Therapy: Patient Spontanous Breathing and Patient connected to face mask oxygen  Post-op Assessment: Report given to PACU RN, Post -op Vital signs reviewed and stable and Patient moving all extremities  Post vital signs: Reviewed and stable  Complications: No apparent anesthesia complications

## 2012-10-07 NOTE — Progress Notes (Signed)
47 yr old W male for lap cholecystectomy for cholelithiasis.   He had previous admission for pancreatitis secondary to alcohol abuse.  Since then he has stopped drinking entirely and cut way back on his cigarette use.  Labs reviewed, and procedure and risks addressed and informed consent obtained.  No significant changes from time of H&P (dict. # [id number]).   Filed Vitals:   10/07/12 0735  BP: 112/70  Pulse:   Temp:   Resp: 22  Pulse  93, temp. 98.4, O2 sat 100% on 8l/min.  I will likely have Dr. Roxy Horseman follow post op. As he saw this pt. On previous admission.

## 2012-10-07 NOTE — Progress Notes (Signed)
Post OP check  Awake and alert.  Dressings are dry and in tact.  Serosanguinous drainage from JP as expected.  Abdomen is soft and pain is under control.  Doing pretty well after difficult open cholecystectomy.  Filed Vitals:   10/07/12 1445  BP: 137/82  Pulse: 88  Temp: 97.7 F (36.5 C)  Resp: 18

## 2012-10-07 NOTE — Progress Notes (Signed)
Patient refused to attempt use of IS at this time due to pain associated with breathing. RT reviewed importance of its use in his recovery. He did perform some deep breaths and agreed to use of IS later. Nightshift RT will be made aware.

## 2012-10-07 NOTE — Consult Note (Signed)
[facility] Medical Consultation  Stanley Thompson WUJ:[id number] DOB: [birth year] DOA: 10/07/2012 PCP: Calla Kicks, MD   Requesting physician: Dr. Malvin Johns, surgery. Date of consultation: 10/07/2012 Reason for consultation: Medical management/management of COPD.  Impression/Recommendations    1. COPD-stable. Continue with current medications. 2. History of alcohol abuse-patient has not drunk any alcohol for approximately 2 months now. 3. Tobacco abuse-patient has cut down significantly on cigarette smoking.  I will followup again tomorrow. Please contact me if I can be of assistance in the meanwhile. Thank you for this consultation.  Chief Complaint: Status post open cholecystectomy for cholelithiasis.  HPI:  This very pleasant 47 year old man presented to the hospital for elective cholecystectomy, which he underwent today with Dr. Malvin Johns. Cholecystectomy required open operation. The patient has a history of COPD from long-standing tobacco abuse. Fortunately, he has cut down on his cigarette smoking over the last month or so. His breathing has improved. He is now postoperative and seems to be doing well with no respiratory distress. There is no fever. There is no chest pain, palpitations or dizziness. He is slightly drowsy. He is in some pain and as a result his blood pressure is somewhat elevated at the moment. He does not have a history of hypertension and has not been taking any medications for this. He has no history of coronary artery disease. He is obese. He was admitted in June 2014 for alcoholic pancreatitis from which he recovered. He was found to have cholelithiasis during that admission.  Review of Systems:  Apart from history of present illness, other systems negative.  Past Medical History  Diagnosis Date  . Asthma   . Pneumonia   . Bronchitis   . COPD (chronic obstructive pulmonary disease)   . Shortness of breath   . Alcoholic pancreatitis    Past  Surgical History  Procedure Laterality Date  . Back surgery  1984    [facility]  . Orif mandibular fracture  2004    [facility]   Social History:  reports that he has been smoking Cigarettes.  He has been smoking about 0.00 packs per day for the past 32 years. He has quit using smokeless tobacco. He reports that he does not drink alcohol or use illicit drugs.  Allergies  Allergen Reactions  . Advair Diskus (Fluticasone-Salmeterol) Swelling    throat   Family History  Problem Relation Age of Onset  . Hypertension Mother   . Cancer Mother   . Hypertension Father   . Heart failure Father   . Cancer Father     Prior to Admission medications   Medication Sig Start Date End Date Taking? Authorizing Provider  albuterol (PROVENTIL) (2.5 MG/3ML) 0.083% nebulizer solution Take 3 mLs (2.5 mg total) by nebulization every 6 (six) hours as needed for wheezing. For shortness of breath/difficulty breathing 08/23/12  Yes Earlie Schank Normajean Glasgow, MD  albuterol-ipratropium (COMBIVENT) 18-103 MCG/ACT inhaler Inhale 2 puffs into the lungs every 6 (six) hours as needed for wheezing or shortness of breath.   Yes Historical Provider, MD  folic acid (FOLVITE) 1 MG tablet Take 1 mg by mouth every other day.   Yes Historical Provider, MD  furosemide (LASIX) 20 MG tablet Take 20 mg by mouth daily.   Yes Historical Provider, MD  potassium phosphate, monobasic, (K-PHOS ORIGINAL) 500 MG tablet Take 250 mg by mouth daily.   Yes Historical Provider, MD   Physical Exam: Blood pressure 157/99, pulse 97, temperature 97.9 F (36.6 C), temperature source  Oral, resp. rate 18, height 6\' 4"  (1.93 m), weight 114.306 kg (252 lb), SpO2 97.00%. Filed Vitals:   10/07/12 1314  BP: 157/99  Pulse: 97  Temp: 97.9 F (36.6 C)  Resp: 18     General:  He looks systemically well. He is slightly drowsy.  Eyes: No jaundice. No pallor.  ENT: Unremarkable.  Neck: No lymphadenopathy.  Cardiovascular: Heart sounds  are present and appear to be in sinus rhythm. There are no murmurs. There is no gallop rhythm. He is clinically not in heart failure.  Respiratory: Lung fields are actually clear without any wheezing, crackles or bronchial breathing.  Abdomen: Soft but somewhat tender secondary to open cholecystectomy.  Skin: No rash.  Musculoskeletal: No acute joint abnormalities.  Psychiatric: Appropriate affect.  Neurologic: Slightly drowsy but appropriate and responds to commands. There are no focal neurological signs.  Labs on Admission:  Basic Metabolic Panel:  Recent Labs Lab 10/03/12 1255  NA 141  K 4.0  CL 105  CO2 27  GLUCOSE 106*  BUN 7  CREATININE 0.70  CALCIUM 9.1   Liver Function Tests:  Recent Labs Lab 10/04/12 1255  AST 26  ALT 12  ALKPHOS 66  BILITOT 0.3  PROT 6.4  ALBUMIN 3.1*    Recent Labs Lab 10/04/12 1255  AMYLASE 36    CBC:  Recent Labs Lab 10/03/12 1255  WBC 5.2  HGB 12.4*  HCT 38.5*  MCV 94.6  PLT 209        Time spent: 30 minutes.  Wilson Singer [facility] Pager [phone].  If 7PM-7AM, please contact night-coverage [email or web address] Password [email or web address] 10/07/2012, 1:34 PM

## 2012-10-08 ENCOUNTER — Encounter (HOSPITAL_COMMUNITY): Payer: Self-pay

## 2012-10-08 ENCOUNTER — Inpatient Hospital Stay (HOSPITAL_COMMUNITY): Payer: Self-pay

## 2012-10-08 LAB — BASIC METABOLIC PANEL
CO2: 27 mEq/L (ref 19–32)
Calcium: 8.9 mg/dL (ref 8.4–10.5)
GFR calc non Af Amer: >90 mL/min (ref >90)
Potassium: 4.4 mEq/L (ref 3.5–5.1)
Sodium: 136 mEq/L (ref 135–145)

## 2012-10-08 LAB — CBC
MCH: 29.9 pg (ref 26.0–34.0)
Platelets: 177 10*3/uL (ref 150–400)
RBC: 4.42 MIL/uL (ref 4.22–5.81)

## 2012-10-08 LAB — LIPASE, BLOOD: Lipase: 21 U/L (ref 11–59)

## 2012-10-08 LAB — HEPATIC FUNCTION PANEL
AST: 33 U/L (ref 0–37)
Bilirubin, Direct: 0.1 mg/dL (ref 0.0–0.3)
Indirect Bilirubin: 0.5 mg/dL (ref 0.3–0.9)

## 2012-10-08 LAB — AMYLASE: Amylase: 22 U/L (ref 0–105)

## 2012-10-08 NOTE — Progress Notes (Signed)
Pt O2 sats were in mid 80s on Nasal Cannula per RT. Pt. Encouraged to cough and deep breathe as well as use incentive spirometer. Patient switched to veni mask per RT and was given a breathing treatment. Will ambulate pt after medicated for pain.

## 2012-10-08 NOTE — Anesthesia Postprocedure Evaluation (Signed)
  Anesthesia Post-op Note  Patient: Stanley Thompson  Procedure(s) Performed: Procedure(s) with comments: ATTEMPTED LAPAROSCOPIC CHOLECYSTECTOMY (N/A) - decision to open at [phone]; converted to open start at 0852 CHOLECYSTECTOMY (N/A) - converted to open; start at 0852  Patient Location: 333  Anesthesia Type:General  Level of Consciousness: awake and patient cooperative  Airway and Oxygen Therapy: Patient Spontanous Breathing  Post-op Pain: mild  Post-op Assessment: Post-op Vital signs reviewed, Patient's Cardiovascular Status Stable and Respiratory Function Stable  Post-op Vital Signs: Reviewed and stable  Complications: No apparent anesthesia complications

## 2012-10-08 NOTE — Progress Notes (Signed)
Pt reports pain has greatly improved. He ambulated to the sink and back to bed with assistance. Denied any dizziness. Pt 02 sat is 90 with Veni mask. Patient reached 1250 with incentive spirometer and was able to cough and deep breathe without severe pain.

## 2012-10-08 NOTE — Op Note (Signed)
NAMELUAY, BALDING NO.:  [id number]  MEDICAL RECORD NO.:  [id number]  LOCATION:  A333                          FACILITY:  [facility]  PHYSICIAN:  Barbaraann Barthel, M.D. DATE OF BIRTH:  [birth year]  DATE OF PROCEDURE:  10/07/2012 DATE OF DISCHARGE:                              OPERATIVE REPORT   DIAGNOSES:  Cholecystitis, cholelithiasis (status post pancreatitis secondary to alcohol abuse.)  PROCEDURE:  Attempted laparoscopic cholecystectomy, converted to open cholecystectomy.  NOTE:  This is a 47 year old white male who was admitted to the hospital in June for pancreatitis secondary to alcohol abuse.  He was noted to have multiple stones within the gallbladder.  It appeared that the alcoholism was his primary problem.  When he was quiescent from this and he was cleared medically, he was taken to surgery and at which time, I attempted laparoscopic cholecystectomy.  However, there was still a lot of residual inflammation and the gallbladder was shrunken and practically its entire lumen was filled with large coalesced stones.  We were not able to remove this laparoscopically and what I deemed to be a safe manner.  OTHER GROSS OPERATIVE FINDINGS:  The patient had an enlarged liver with nodules pictures were taken of this.  It is possible this patient in fact might have carcinoma of the gallbladder.  At any rate, the rest of his abdominal exploration was otherwise grossly within normal limits.  TECHNIQUE:  The patient was placed in supine position.  After the adequate administration of general anesthesia via endotracheal intubation, the NG tube was placed, a Foley catheter was placed.  His abdomen was prepped with Betadine solution and draped in usual manner. A periumbilical incision was carried out over the superior aspect of the umbilicus.  A Veress needle was inserted and confirmed in position with a saline drop test.  The abdomen was then insufflated.  We then  placed 11-mm cannula in the umbilicus and then under direct vision, an 11-mm cannula in the epigastrium.  There were a number of adhesions, these were taken down, and then I placed two 5-mm cannulas in the right upper quadrant laterally.  I took down adhesions from the gallbladder, but in essence the gallbladder was entirely encased in these adhesions and we shriveled up and difficult because of the lumen was entirely taken up with large stones and I did not feel that I could dissect this safely. We then converted to an open procedure.  I did a subcostal incision on the right side through skin, subcutaneous tissue, the rectus muscle which was divided with a cautery device, and then the peritoneum was then opened and the abdomen was explored with the above findings.  We then dissected the gallbladder, taking down the adhesions that encased it and then going from the fundus down towards Hartmann pouch, carefully dissecting down.  The cystic duct was ligated and clipped.  There were number of adhesions.  I clipped them as well and the cystic artery was clipped and ligated as well.  The gallbladder was then removed.  I did not attempt to do a cholangiogram on this patient as there was still too much inflammation and I kept very closely to the gallbladder, so  I am certain that there was no problems to the common bile duct.  Once this was done, I checked for hemostasis.  We irrigated with normal saline solution.  I elected to leave a Jackson-Pratt drain in the liver bed after placing a piece of Surgicel on the liver bed and checking for bleeding with a cautery device.  The Jackson-Pratt drain exited from one of the lateral port sites.  I then closed the fascia and the peritoneum with 0 Vicryl sutures.  The peritoneum was closed in a running manner and the fascia with interrupted figure-of-eight 0 Vicryl sutures.  I used about 10 mL 0.5% Sensorcaine along the wound to help with postoperative  comfort.  The procedure went well.  The dissection was difficult, but everything went well in that regard.  We then placed a sterile dressing over the wound and prior to closure, all sponge, needle, instrument counts were found to be correct.  I did elect to leave a Jackson-Pratt drain and a Foley catheter in him until he is more ambulatory and we are sure of his status with his fluids.  Estimated blood loss maybe 200 mL.  He received about 2200 mL crystalloids intraoperatively.  There were no complications.  Note, I have asked Dr. Karilyn Cota who saw this patient in June to follow with me medically, after which he will be returned to the [facility], specifically Dr. Calla Kicks.     Barbaraann Barthel, M.D.     WB/MEDQ  D:  10/07/2012  T:  10/08/2012  Job:  [id number]  cc:   Dr. Karilyn Cota  Dr. Calla Kicks [facility]

## 2012-10-08 NOTE — Progress Notes (Signed)
POD # 1  Doing very well post OP; pain controlled.  Abdomen is completely soft with hypoactive bowel sounds but I will D/C  NG and foley and increase activity and diet to full liquids.     Wound redressed and labs look good without significant bump in LFS or pancreatic enzymes.  Good progress.  Likely home in AM if OK with hospitalist. Will D/C  JP prior to discharge.   Filed Vitals:   10/08/12 0417  BP: 135/87  Pulse: 96  Temp: 98.5 F (36.9 C)  Resp: 16

## 2012-10-08 NOTE — Progress Notes (Signed)
  Stanley Thompson:[id number] DOB: [birth year] DOA: 10/07/2012 PCP: Calla Kicks, MD   Subjective: This man says that his breathing is a little bit harder and he has a cough, unable to expectorate. He has not been desaturating. He had open cholecystectomy yesterday.           Physical Exam: Blood pressure 135/87, pulse 96, temperature 98.5 F (36.9 C), temperature source Oral, resp. rate 16, height 6\' 4"  (1.93 m), weight 114.306 kg (252 lb), SpO2 96.00%. She looks systemically well. Is not toxic or septic clinically. There does not appear to be significantly increased work of breathing. There is no peripheral central cyanosis. Lung fields bilaterally show some scattered crackles and rhonchi. There is no bronchial breathing. Heart sounds are present and normal.   Investigations:     Basic Metabolic Panel:  Recent Labs  81/19/14 0628  NA 136  K 4.4  CL 100  CO2 27  GLUCOSE 106*  BUN 4*  CREATININE 0.59  CALCIUM 8.9   Liver Function Tests:  Recent Labs  10/08/12 0628  AST 33  ALT 16  ALKPHOS 65  BILITOT 0.6  PROT 6.2  ALBUMIN 2.7*     CBC:  Recent Labs  10/08/12 0628  WBC 8.4  HGB 13.2  HCT 41.6  MCV 94.1  PLT 177    No results found.    Medications: I have reviewed the patient's current medications.  Impression: 1. COPD.  2. Likely atelectasis versus pneumonia. 3. Status post cholecystectomy yesterday.     Plan: 1. Chest x-ray this morning. 2. Encourage incentive spirometry. 3. Continue with intravenous Rocephin. We may need to broaden coverage depending on results of chest x-ray.  Consultants:  None.   Procedures:  None.   Antibiotics:  Intravenous Rocephin started 10/07/2012.                   Code Status: Full code.  Family Communication: Discussed plan with patient at the bedside.   Disposition Plan: Home when medically stable and cleared by Dr. Malvin Johns, surgery.  Time spent: 20 minutes.   LOS: 1 day    Wilson Singer Pager [phone]  10/08/2012, 9:59 AM

## 2012-10-08 NOTE — Plan of Care (Signed)
Problem: Phase I Progression Outcomes Goal: Pain controlled with appropriate interventions Talked with pt about diff pain control methods

## 2012-10-09 ENCOUNTER — Inpatient Hospital Stay (HOSPITAL_COMMUNITY): Payer: Self-pay

## 2012-10-09 DIAGNOSIS — I10 Essential (primary) hypertension: Secondary | ICD-10-CM

## 2012-10-09 DIAGNOSIS — I2699 Other pulmonary embolism without acute cor pulmonale: Secondary | ICD-10-CM | POA: Diagnosis present

## 2012-10-09 DIAGNOSIS — J189 Pneumonia, unspecified organism: Secondary | ICD-10-CM

## 2012-10-09 LAB — CBC
Hemoglobin: 12.4 g/dL — ABNORMAL LOW (ref 13.0–17.0)
RBC: 4.19 MIL/uL — ABNORMAL LOW (ref 4.22–5.81)

## 2012-10-09 LAB — BASIC METABOLIC PANEL
CO2: 29 mEq/L (ref 19–32)
Glucose, Bld: 114 mg/dL — ABNORMAL HIGH (ref 70–99)
Potassium: 4 mEq/L (ref 3.5–5.1)
Sodium: 135 mEq/L (ref 135–145)

## 2012-10-09 LAB — BLOOD GAS, ARTERIAL
Acid-Base Excess: 3.7 mmol/L — ABNORMAL HIGH (ref 0.0–2.0)
Bicarbonate: 27.7 mEq/L — ABNORMAL HIGH (ref 20.0–24.0)
O2 Saturation: 94.9 %
Patient temperature: 37
TCO2: 24.9 mmol/L (ref 0–100)

## 2012-10-09 LAB — PROTIME-INR: INR: 1.14 (ref 0.00–1.49)

## 2012-10-09 LAB — HEPARIN LEVEL (UNFRACTIONATED)
Heparin Unfractionated: <0.1 IU/mL — ABNORMAL LOW (ref 0.30–0.70)
Heparin Unfractionated: <0.1 IU/mL — ABNORMAL LOW (ref 0.30–0.70)

## 2012-10-09 LAB — APTT: aPTT: 33 seconds (ref 24–37)

## 2012-10-09 MED ORDER — GUAIFENESIN ER 600 MG PO TB12
1200.0000 mg | ORAL_TABLET | Freq: Two times a day (BID) | ORAL | Status: DC
  Administered 2012-10-09 – 2012-10-14 (×10): 1200 mg via ORAL
  Filled 2012-10-09 (×10): qty 2

## 2012-10-09 MED ORDER — ACETYLCYSTEINE 10 % IN SOLN
2.0000 mL | RESPIRATORY_TRACT | Status: DC
  Filled 2012-10-09 (×6): qty 4

## 2012-10-09 MED ORDER — MORPHINE SULFATE 2 MG/ML IJ SOLN
2.0000 mg | INTRAMUSCULAR | Status: DC | PRN
  Administered 2012-10-09: 4 mg via INTRAVENOUS
  Administered 2012-10-09 (×2): 2 mg via INTRAVENOUS
  Administered 2012-10-09: 4 mg via INTRAVENOUS
  Administered 2012-10-09 (×4): 2 mg via INTRAVENOUS
  Administered 2012-10-10 (×6): 4 mg via INTRAVENOUS
  Administered 2012-10-10: 2 mg via INTRAVENOUS
  Administered 2012-10-10 – 2012-10-11 (×4): 4 mg via INTRAVENOUS
  Administered 2012-10-11: 2 mg via INTRAVENOUS
  Administered 2012-10-11 (×2): 4 mg via INTRAVENOUS
  Administered 2012-10-12: 2 mg via INTRAVENOUS
  Administered 2012-10-12: 4 mg via INTRAVENOUS
  Administered 2012-10-12: 2 mg via INTRAVENOUS
  Administered 2012-10-12 – 2012-10-13 (×2): 4 mg via INTRAVENOUS
  Administered 2012-10-13 (×2): 2 mg via INTRAVENOUS
  Filled 2012-10-09 (×2): qty 1
  Filled 2012-10-09 (×2): qty 2
  Filled 2012-10-09: qty 1
  Filled 2012-10-09 (×2): qty 2
  Filled 2012-10-09: qty 1
  Filled 2012-10-09 (×3): qty 2
  Filled 2012-10-09: qty 1
  Filled 2012-10-09: qty 2
  Filled 2012-10-09: qty 1
  Filled 2012-10-09: qty 2
  Filled 2012-10-09 (×2): qty 1
  Filled 2012-10-09 (×3): qty 2
  Filled 2012-10-09: qty 1
  Filled 2012-10-09 (×2): qty 2
  Filled 2012-10-09 (×3): qty 1
  Filled 2012-10-09 (×4): qty 2

## 2012-10-09 MED ORDER — ALBUTEROL SULFATE (5 MG/ML) 0.5% IN NEBU
2.5000 mg | INHALATION_SOLUTION | RESPIRATORY_TRACT | Status: DC
  Administered 2012-10-09 – 2012-10-13 (×22): 2.5 mg via RESPIRATORY_TRACT
  Filled 2012-10-09 (×23): qty 0.5

## 2012-10-09 MED ORDER — ACETYLCYSTEINE 20 % IN SOLN
RESPIRATORY_TRACT | Status: AC
  Filled 2012-10-09: qty 4

## 2012-10-09 MED ORDER — ACETYLCYSTEINE 10 % IN SOLN
2.0000 mL | RESPIRATORY_TRACT | Status: DC
  Administered 2012-10-09: 2 mL via RESPIRATORY_TRACT
  Filled 2012-10-09 (×6): qty 4

## 2012-10-09 MED ORDER — DEXTROSE 5 % IV SOLN
2.0000 g | Freq: Three times a day (TID) | INTRAVENOUS | Status: DC
  Administered 2012-10-09 – 2012-10-13 (×12): 2 g via INTRAVENOUS
  Filled 2012-10-09 (×13): qty 2

## 2012-10-09 MED ORDER — IPRATROPIUM BROMIDE 0.02 % IN SOLN
RESPIRATORY_TRACT | Status: AC
  Administered 2012-10-09: 0.5 mg
  Filled 2012-10-09: qty 2.5

## 2012-10-09 MED ORDER — DEXTROSE 5 % IV SOLN
INTRAVENOUS | Status: AC
  Filled 2012-10-09: qty 2

## 2012-10-09 MED ORDER — DEXTROSE 5 % IV SOLN
2.0000 g | Freq: Once | INTRAVENOUS | Status: AC
  Administered 2012-10-09: 2 g via INTRAVENOUS
  Filled 2012-10-09: qty 2

## 2012-10-09 MED ORDER — VANCOMYCIN HCL 10 G IV SOLR
1250.0000 mg | Freq: Two times a day (BID) | INTRAVENOUS | Status: DC
  Administered 2012-10-09 – 2012-10-13 (×7): 1250 mg via INTRAVENOUS
  Filled 2012-10-09 (×8): qty 1250

## 2012-10-09 MED ORDER — ACETYLCYSTEINE 20 % IN SOLN
RESPIRATORY_TRACT | Status: AC
  Administered 2012-10-10: 2 mL
  Filled 2012-10-09: qty 4

## 2012-10-09 MED ORDER — VANCOMYCIN HCL 10 G IV SOLR
1500.0000 mg | Freq: Once | INTRAVENOUS | Status: AC
  Administered 2012-10-09: 1500 mg via INTRAVENOUS
  Filled 2012-10-09: qty 1500

## 2012-10-09 MED ORDER — HEPARIN (PORCINE) IN NACL 100-0.45 UNIT/ML-% IJ SOLN
2800.0000 [IU]/h | INTRAMUSCULAR | Status: AC
  Administered 2012-10-09: 1700 [IU]/h via INTRAVENOUS
  Administered 2012-10-09: 2000 [IU]/h via INTRAVENOUS
  Administered 2012-10-10: 2400 [IU]/h via INTRAVENOUS
  Administered 2012-10-10 – 2012-10-13 (×8): 2800 [IU]/h via INTRAVENOUS
  Filled 2012-10-09 (×11): qty 250

## 2012-10-09 MED ORDER — ACETYLCYSTEINE 20 % IN SOLN
RESPIRATORY_TRACT | Status: AC
  Administered 2012-10-09: 800 mg
  Filled 2012-10-09: qty 4

## 2012-10-09 MED ORDER — IOHEXOL 350 MG/ML SOLN
100.0000 mL | Freq: Once | INTRAVENOUS | Status: AC | PRN
  Administered 2012-10-09: 100 mL via INTRAVENOUS

## 2012-10-09 NOTE — Progress Notes (Signed)
ANTICOAGULATION CONSULT NOTE - Initial Consult  Pharmacy Consult for Heparin Indication: pulmonary embolus  Allergies  Allergen Reactions  . Advair Diskus (Fluticasone-Salmeterol) Swelling    throat    Patient Measurements: Height: 6\' 4"  (193 cm) Weight: 254 lb 10.1 oz (115.5 kg) IBW/kg (Calculated) : 86.8 Heparin Dosing Weight: 109 kg  Vital Signs: Temp: 98.4 F (36.9 C) (08/10 1200) Temp src: Oral (08/10 1200) BP: 138/85 mmHg (08/10 1800) Pulse Rate: 104 (08/10 1800)  Labs:  Recent Labs  10/08/12 0628 10/09/12 0453 10/09/12 1201 10/09/12 2128  HGB 13.2 12.4*  --   --   HCT 41.6 39.3  --   --   PLT 177 195  --   --   APTT  --  33  --   --   LABPROT  --  14.4  --   --   INR  --  1.14  --   --   HEPARINUNFRC  --   --  <0.10* <0.10*  CREATININE 0.59 0.65  --   --     Estimated Creatinine Clearance: 157 ml/min (by C-G formula based on Cr of 0.65).   Medical History: Past Medical History  Diagnosis Date  . Asthma   . Pneumonia   . Bronchitis   . COPD (chronic obstructive pulmonary disease)   . Shortness of breath   . Alcoholic pancreatitis     Medications:  Scheduled:  . acetylcysteine  2 mL Nebulization Q4H  . albuterol  2.5 mg Nebulization Q4H  . antiseptic oral rinse  15 mL Mouth Rinse BID  . ceFEPime (MAXIPIME) IV  2 g Intravenous Q8H  . furosemide  20 mg Oral Daily  . guaiFENesin  1,200 mg Oral BID  . vancomycin  1,250 mg Intravenous Q12H    Assessment: Acute respiratory failure Pulmonary emboli of middle and right lower lobe Recent surgery, no bolus Heparin level below goal  Goal of Therapy:  Heparin level 0.3-0.7 units/ml Monitor platelets by anticoagulation protocol: Yes   Plan:  Increase Heparin rate to 2400 Units/hr  Unfractionated heparin level at 5 AM Monitor CBC, platelets   Annmarie Plemmons Bennett 10/09/2012,10:15 PM

## 2012-10-09 NOTE — Progress Notes (Signed)
O2 sat on 5L dropped to 80's. Tried venturi mask with no improvement, placed on NRB. BP 131/83 HR 108 95% on nrb, RR 25. Pt diaphoretic. Requiring morphine every two hours due to right back/chest pain. Dr Karilyn Cota notified of all of above. ABG and cxray ordered STAT.

## 2012-10-09 NOTE — Progress Notes (Signed)
Pt given extra albuterol and atrovent neb as he complains of chest tightness , little or no wheezes noted, Pt sat 88-86 on nrb mask. Md notified, pt sent to CAT Scan to check for PE. Results pending at this note. Pt does have history of asthma, and still smokes.

## 2012-10-09 NOTE — Anesthesia Postprocedure Evaluation (Signed)
  Anesthesia Post-op Note  Patient: Stanley Thompson  Procedure(s) Performed: Procedure(s) with comments: ATTEMPTED LAPAROSCOPIC CHOLECYSTECTOMY (N/A) - decision to open at [phone]; converted to open start at 0852 CHOLECYSTECTOMY (N/A) - converted to open; start at 0852  Patient Location: ICU  Anesthesia Type:General  Level of Consciousness: awake, oriented and patient cooperative  Airway and Oxygen Therapy: Patient Spontanous Breathing and Patient connected to nasal cannula oxygen  Post-op Pain: mild  Post-op Assessment: Post-op Vital signs reviewed, Patient's Cardiovascular Status Stable, Respiratory Function Stable, Patent Airway, No signs of Nausea or vomiting, Adequate PO intake and Pain level controlled  Post-op Vital Signs: Reviewed and stable  Complications: No apparent anesthesia complications. Patient now in ICU for pneumonia, Right side lung Pulmonary Embolus and possible aspiration. Patient Oxygen Saturations 94-95% 104 pulse and 132/90.

## 2012-10-09 NOTE — Progress Notes (Addendum)
CARLEY STRICKLING ZOX:[id number] DOB: 03-07-1965 DOA: 10/07/2012  Nocturnist on call note:  Called on patient at about 2:50 AM;  nurse and respiratory therapist noted that he is hypoxic satting in the high 80s, despite 100% nonrebreathing mask, and nebs; he is  almost 2 days postop, open cholecystectomy.  Noted to be tachycardic; appears surprisingly comfortable; does not appear to be in respiratory distress; no wheezing noted.  CT angiogram for acute refractory respiratory failure showed  pulmonary emboli in segments of middle and right lower lobe; also pneumonia in the left lower lobe, later reported as probable aspiration pneumonia because of fluid in the bronchus.  Because of acute refractory respiratory failure, patient was transferred to the ICU,  started on full anticoagulations with heparin without bolus, and vancomycin and cefepime, initially for hospital associated pneumonia, but should also cover aspiration pneumonia.  Critical care time: 30 minutes.  Roxine Whittinghill 10/09/2012 5:40 AM

## 2012-10-09 NOTE — Progress Notes (Signed)
ANTICOAGULATION CONSULT NOTE - Initial Consult  Pharmacy Consult for Heparin Indication: pulmonary embolus  Allergies  Allergen Reactions  . Advair Diskus (Fluticasone-Salmeterol) Swelling    throat    Patient Measurements: Height: 6\' 4"  (193 cm) Weight: 254 lb 10.1 oz (115.5 kg) IBW/kg (Calculated) : 86.8 Heparin Dosing Weight: 109 kg  Vital Signs: Temp: 99.3 F (37.4 C) (08/10 0444) Temp src: Oral (08/10 0444) BP: 132/90 mmHg (08/10 0800) Pulse Rate: 107 (08/10 0800)  Labs:  Recent Labs  10/08/12 0628 10/09/12 0453  HGB 13.2 12.4*  HCT 41.6 39.3  PLT 177 195  APTT  --  33  LABPROT  --  14.4  INR  --  1.14  CREATININE 0.59 0.65    Estimated Creatinine Clearance: 157 ml/min (by C-G formula based on Cr of 0.65).   Medical History: Past Medical History  Diagnosis Date  . Asthma   . Pneumonia   . Bronchitis   . COPD (chronic obstructive pulmonary disease)   . Shortness of breath   . Alcoholic pancreatitis     Medications:  Scheduled:  . antiseptic oral rinse  15 mL Mouth Rinse BID  . ceFEPime (MAXIPIME) IV  2 g Intravenous Q8H  . furosemide  20 mg Oral Daily  . vancomycin  1,250 mg Intravenous Q12H    Assessment: Acute respiratory failure Pulmonary emboli of middle and right lower lobe Recent surgery, no bolus  Goal of Therapy:  Heparin level 0.3-0.7 units/ml Monitor platelets by anticoagulation protocol: Yes   Plan:  Start heparin infusion at 1700 units/hr Check anti-Xa level in 6 to 8 hours and daily while on heparin Continue to monitor H&H and platelets   Raquel James, Teresea Donley Bennett 10/09/2012,8:07 AM

## 2012-10-09 NOTE — Progress Notes (Signed)
Dr Juanetta Gosling called, at Dr Patty Sermons request, with ABG and cxray results. Order received to give albuterol and mucomyst every 4 hours scheduled. Will continue to monitor patient closely.

## 2012-10-09 NOTE — Progress Notes (Signed)
Patient frequently encouraged to cough and deep breathe, as he has been breathing shallow due to pain. Morphine offered every two hours, and instructions on splinting surgical site reiterated.

## 2012-10-09 NOTE — Consult Note (Signed)
Consult requested by: Dr. Karilyn Cota Consult requested for pulmonary embolus/pneumonia:  HPI: This is a 47 year old who came in for planned laparoscopic cholecystectomy but had to be converted to open cholecystectomy. He had done well postop but developed shortness of breath cough congestion and chest pain. CT scan was done which showed what appears to be a left-sided pneumonia questionably aspiration and right-sided pulmonary emboli. He complains of pain mostly on the right which is pleuritic. He has a previous history of COPD and multiple episodes of pneumonia. He has been able to reduce her smoking substantially but has not totally stopped. He does not know of any history of clotting in the family and there is a history of lung cancer and pancreatic cancer  Past Medical History  Diagnosis Date  . Asthma   . Pneumonia   . Bronchitis   . COPD (chronic obstructive pulmonary disease)   . Shortness of breath   . Alcoholic pancreatitis      Family History  Problem Relation Age of Onset  . Hypertension Mother   . Cancer Mother   . Hypertension Father   . Heart failure Father   . Cancer Father      History   Social History  . Marital Status: Married    Spouse Name: N/A    Number of Children: N/A  . Years of Education: N/A   Social History Main Topics  . Smoking status: Current Some Day Smoker -- 32 years    Types: Cigarettes    Last Attempt to Quit: 07/19/2012  . Smokeless tobacco: Former Neurosurgeon     Comment: 4 packs of cigarettes per week   . Alcohol Use: No     Comment: former drinker, last drink was 08/17/12  . Drug Use: No  . Sexually Active: Yes   Other Topics Concern  . None   Social History Narrative  . None     ROS: He has not coughed up any blood. He has been mildly short of breath. He has chest pain as noted. He has now been a mild cough up any sputum at all    Objective: Vital signs in last 24 hours: Temp:  [98.3 F (36.8 C)-100.3 F (37.9 C)] 99.3 F (37.4  C) (08/10 0444) Pulse Rate:  [102-116] 107 (08/10 0800) Resp:  [18-32] 21 (08/10 0800) BP: (94-148)/(73-92) 132/90 mmHg (08/10 0800) SpO2:  [82 %-100 %] 96 % (08/10 0800) FiO2 (%):  [10 %-100 %] 10 % (08/10 0727) Weight:  [115.5 kg (254 lb 10.1 oz)] 115.5 kg (254 lb 10.1 oz) (08/10 0444) Weight change:  Last BM Date: 10/07/12  Intake/Output from previous day: 08/09 0701 - 08/10 0700 In: 240 [P.O.:240] Out: 1500 [Urine:1350; Drains:150]  PHYSICAL EXAM He is awake and alert. His mucous membranes are moist. His neck is supple without masses. His HEENT exam otherwise is essentially normal. His chest shows that he is splinting the right side. He has some rhonchi bilaterally. His heart is regular with a mild tachycardia. His abdomen is soft but postop. Extremities showed no edema. Central nervous system exam grossly intact  Lab Results: Basic Metabolic Panel:  Recent Labs  40/98/11 0628 10/09/12 0453  NA 136 135  K 4.4 4.0  CL 100 98  CO2 27 29  GLUCOSE 106* 114*  BUN 4* 4*  CREATININE 0.59 0.65  CALCIUM 8.9 8.9   Liver Function Tests:  Recent Labs  10/08/12 0628  AST 33  ALT 16  ALKPHOS 65  BILITOT 0.6  PROT 6.2  ALBUMIN 2.7*    Recent Labs  10/08/12 0628  LIPASE 21  AMYLASE 22   No results found for this basename: AMMONIA,  in the last 72 hours CBC:  Recent Labs  10/08/12 0628 10/09/12 0453  WBC 8.4 9.2  HGB 13.2 12.4*  HCT 41.6 39.3  MCV 94.1 93.8  PLT 177 195   Cardiac Enzymes: No results found for this basename: CKTOTAL, CKMB, CKMBINDEX, TROPONINI,  in the last 72 hours BNP: No results found for this basename: PROBNP,  in the last 72 hours D-Dimer: No results found for this basename: DDIMER,  in the last 72 hours CBG: No results found for this basename: GLUCAP,  in the last 72 hours Hemoglobin A1C: No results found for this basename: HGBA1C,  in the last 72 hours Fasting Lipid Panel: No results found for this basename: CHOL, HDL, LDLCALC,  TRIG, CHOLHDL, LDLDIRECT,  in the last 72 hours Thyroid Function Tests: No results found for this basename: TSH, T4TOTAL, FREET4, T3FREE, THYROIDAB,  in the last 72 hours Anemia Panel: No results found for this basename: VITAMINB12, FOLATE, FERRITIN, TIBC, IRON, RETICCTPCT,  in the last 72 hours Coagulation:  Recent Labs  10/09/12 0453  LABPROT 14.4  INR 1.14   Urine Drug Screen: Drugs of Abuse  No results found for this basename: labopia, cocainscrnur, labbenz, amphetmu, thcu, labbarb    Alcohol Level: No results found for this basename: ETH,  in the last 72 hours Urinalysis: No results found for this basename: COLORURINE, APPERANCEUR, LABSPEC, PHURINE, GLUCOSEU, HGBUR, BILIRUBINUR, KETONESUR, PROTEINUR, UROBILINOGEN, NITRITE, LEUKOCYTESUR,  in the last 72 hours Misc. Labs:   ABGS: No results found for this basename: PHART, PCO2, PO2ART, TCO2, HCO3,  in the last 72 hours   MICROBIOLOGY: No results found for this or any previous visit (from the past 240 hour(s)).  Studies/Results: Dg Chest 1 View  10/08/2012   *RADIOLOGY REPORT*  Clinical Data: Congestion post cholecystectomy.  CHEST - 1 VIEW  Comparison: 08/19/2012  Findings: Slightly decreased lung volumes.  New left infrahilar atelectasis or consolidation.  There is some linear subsegmental atelectasis or scarring medially in both lung bases.  No effusion. No pneumothorax.  Nasogastric tube extends at least as far as the stomach, tip not seen.  Heart size upper limits normal for technique.  IMPRESSION:  Relatively low lung volumes with new left infrahilar atelectasis or consolidation.   Original Report Authenticated By: D. Hassell III, MD   Ct Angio Chest Pe W/cm &/or Wo Cm  10/09/2012   *RADIOLOGY REPORT*  Clinical Data: Acute hypoxia; chest pain.  CT ANGIOGRAPHY CHEST  Technique:  Multidetector CT imaging of the chest using the standard protocol during bolus administration of intravenous contrast. Multiplanar reconstructed  images including MIPs were obtained and reviewed to evaluate the vascular anatomy.  Contrast: OMNIPAQUE IOHEXOL 350 MG/ML SOLN  Comparison: Chest radiograph performed 10/08/2012  Findings: There is pulmonary embolus within segmental branches to the right middle and lower lung lobes.  There is partial consolidation of the left lower lobe; this appears to reflect debris and fluid filling the bronchus to the left lower lobe, concerning for aspiration pneumonia.  An associated small left pleural effusion is seen.  A trace right pleural effusion is noted.  There is no evidence of pneumothorax.  No masses are identified; no abnormal focal contrast enhancement is seen.  The mediastinum is unremarkable in appearance.  No mediastinal lymphadenopathy is seen.  No pericardial effusion is identified. No axillary  lymphadenopathy is seen.  The thyroid gland is unremarkable in appearance.  A small amount of possibly postoperative ascites is noted about the liver.  The visualized portions of the liver and spleen are grossly unremarkable in appearance.  The patient is status post cholecystectomy, with a drainage catheter noted at the gallbladder fossa.  The visualized portions of the pancreas, adrenal glands and kidneys are grossly unremarkable.  No acute osseous abnormalities are seen.  IMPRESSION:  1.  Pulmonary embolus within segmental branches to the right middle and lower lung lobes. 2.  Partial consolidation of the left lower lung lobe, with debris and fluid filling the bronchus to the left lower lobe, concerning for aspiration pneumonia.  Associated small left pleural effusion seen. 3.  Trace right pleural effusion noted. 4.  Small amount of possibly postoperative ascites noted about the liver, status post cholecystectomy.  Drainage catheter noted at the gallbladder fossa.  Critical Value/emergent results were called by telephone at the time of interpretation on 10/09/2012 at 03:48 a.m. to Dr. Vania Rea, who  verbally acknowledged these results.   Original Report Authenticated By: Tonia Ghent, M.D.    Medications:  Prior to Admission:  Prescriptions prior to admission  Medication Sig Dispense Refill  . albuterol (PROVENTIL) (2.5 MG/3ML) 0.083% nebulizer solution Take 3 mLs (2.5 mg total) by nebulization every 6 (six) hours as needed for wheezing. For shortness of breath/difficulty breathing  75 mL  0  . albuterol-ipratropium (COMBIVENT) 18-103 MCG/ACT inhaler Inhale 2 puffs into the lungs every 6 (six) hours as needed for wheezing or shortness of breath.      . folic acid (FOLVITE) 1 MG tablet Take 1 mg by mouth every other day.      . furosemide (LASIX) 20 MG tablet Take 20 mg by mouth daily.      . potassium phosphate, monobasic, (K-PHOS ORIGINAL) 500 MG tablet Take 250 mg by mouth daily.       Scheduled: . antiseptic oral rinse  15 mL Mouth Rinse BID  . ceFEPime (MAXIPIME) IV  2 g Intravenous Q8H  . furosemide  20 mg Oral Daily  . vancomycin  1,250 mg Intravenous Q12H   Continuous: . 0.9 % NaCl with KCl 20 mEq / L 100 mL/hr at 10/08/12 1636  . heparin 1,700 Units/hr (10/09/12 0900)   XBJ:YNWGNFAOZ, Ipratropium-Albuterol, morphine injection, ondansetron (ZOFRAN) IV, ondansetron  Assesment: He has acute pulmonary embolism. He has hospital acquired pneumonia. He is on appropriate treatment for both. He has had a cholecystectomy. He has COPD. He is at risk of developing increasing problems with his respiratory status because of the pain from his pulmonary embolus. I agree that heparin is the most reasonable choice for anti-coagulation now and I also agree that I would not start Coumadin because he would be a good candidate for Lajoyce Lauber once he is stable. Active Problems:   Acute pulmonary embolism   HCAP (healthcare-associated pneumonia)    Plan: I would continue efforts to cough deep breathe incentive spirometry etc. Continue anti-, coagulation.  Thanks for allow me to see him with  you    LOS: 2 days   Nnamdi Dacus L 10/09/2012, 9:05 AM

## 2012-10-09 NOTE — Progress Notes (Signed)
ANTIBIOTIC CONSULT NOTE - INITIAL  Pharmacy Consult for Vancomycin & Cefepime Indication: pneumonia  Allergies  Allergen Reactions  . Advair Diskus (Fluticasone-Salmeterol) Swelling    throat    Patient Measurements: Height: 6\' 4"  (193 cm) Weight: 254 lb 10.1 oz (115.5 kg) IBW/kg (Calculated) : 86.8   Vital Signs: Temp: 99.3 F (37.4 C) (08/10 0444) Temp src: Oral (08/10 0444) BP: 135/82 mmHg (08/10 0700) Pulse Rate: 102 (08/10 0700) Intake/Output from previous day: 08/09 0701 - 08/10 0700 In: 240 [P.O.:240] Out: 1500 [Urine:1350; Drains:150] Intake/Output from this shift:    Labs:  Recent Labs  10/08/12 0628 10/09/12 0453  WBC 8.4 9.2  HGB 13.2 12.4*  PLT 177 195  CREATININE 0.59 0.65   Estimated Creatinine Clearance: 157 ml/min (by C-G formula based on Cr of 0.65). No results found for this basename: VANCOTROUGH, VANCOPEAK, VANCORANDOM, GENTTROUGH, GENTPEAK, GENTRANDOM, TOBRATROUGH, TOBRAPEAK, TOBRARND, AMIKACINPEAK, AMIKACINTROU, AMIKACIN,  in the last 72 hours   Microbiology: No results found for this or any previous visit (from the past 720 hour(s)).  Medical History: Past Medical History  Diagnosis Date  . Asthma   . Pneumonia   . Bronchitis   . COPD (chronic obstructive pulmonary disease)   . Shortness of breath   . Alcoholic pancreatitis     Medications:  Scheduled:  . antiseptic oral rinse  15 mL Mouth Rinse BID  . ceFEPime (MAXIPIME) IV  2 g Intravenous Q8H  . furosemide  20 mg Oral Daily  . vancomycin  1,250 mg Intravenous Q12H   Assessment: Hospital associated pneumonia, aspiration pneumonia Excellent renal function Patient weight 115.5 kg Vancomycin 1500 mg IV given Cefepime 2 GM IV given  Goal of Therapy:  Vancomycin trough level 15-20 mcg/ml  Plan:  Vancomycin 1250 mg IV every 12 hours Cefepime 2 GM IV every 8 hours Vancomycin trough at steady state Monitor renal function Labs per protocol  Raquel James, Vaughan Garfinkle  Bennett 10/09/2012,8:03 AM

## 2012-10-09 NOTE — Progress Notes (Signed)
POD # 2  Pt has had PE since I saw him yesterday at which time he was doing quite well and I had actually planned to discharge him.  I was informed this AM by Dr. Karilyn Cota of the events of last evening, and pt has been transferred to hospitalist's service and I will continue to follow him  Surgically.  Abdomen remains soft. Serosanguinous drainage from JP which I will remove soon. Lungs scattered rhonchi and stinting on right from incisional pain. Labs noted.   O2 sat 92% on 4 L/min.  He has been anticoagulated.  INR 1.14.   He has been seen by Dr. Juanetta Gosling who is helping with his respiratory status.  He is stable from surgical point of view  Filed Vitals:   10/09/12 1200  BP: 133/91  Pulse: 106  Temp:   Resp: 16  temp. 98.4.Marland Kitchen

## 2012-10-09 NOTE — Progress Notes (Signed)
Stanley Thompson ZOX:[id number] DOB: [birth year] DOA: 10/07/2012 PCP: Calla Kicks, MD   Subjective: This man was transferred to the intensive care unit yesterday. He tells me the sometime in the afternoon he suddenly became more short of breath. He required increasing amounts of oxygen to maintain saturations. My colleague was contacted approximately 3 AM this morning when he was noted to be saturating only in the high 80s despite 100% nonrebreather mask. CT angiogram of the chest confirmed the presence of pulmonary emboli in the right lung and also pneumonia in the left lung with the possibility of aspiration. He has been started on intravenous heparin and antibiotics have been changed for broader coverage.           Physical Exam: Blood pressure 132/90, pulse 107, temperature 99.3 F (37.4 C), temperature source Oral, resp. rate 21, height 6\' 4"  (1.93 m), weight 115.5 kg (254 lb 10.1 oz), SpO2 96.00%. He looks does appear to have increased work of breathing. His saturations are in the high 80s with 4 L oxygen by nasal cannula at the present time. Is not toxic or septic clinically. There is no peripheral central cyanosis. Lung fields bilaterally show some scattered crackles and rhonchi. There is no bronchial breathing. Heart sounds are present and normal.   Investigations:     Basic Metabolic Panel:  Recent Labs  81/19/14 0628 10/09/12 0453  NA 136 135  K 4.4 4.0  CL 100 98  CO2 27 29  GLUCOSE 106* 114*  BUN 4* 4*  CREATININE 0.59 0.65  CALCIUM 8.9 8.9   Liver Function Tests:  Recent Labs  10/08/12 0628  AST 33  ALT 16  ALKPHOS 65  BILITOT 0.6  PROT 6.2  ALBUMIN 2.7*     CBC:  Recent Labs  10/08/12 0628 10/09/12 0453  WBC 8.4 9.2  HGB 13.2 12.4*  HCT 41.6 39.3  MCV 94.1 93.8  PLT 177 195    Dg Chest 1 View  10/08/2012   *RADIOLOGY REPORT*  Clinical Data: Congestion post cholecystectomy.  CHEST - 1 VIEW  Comparison: 08/19/2012  Findings:  Slightly decreased lung volumes.  New left infrahilar atelectasis or consolidation.  There is some linear subsegmental atelectasis or scarring medially in both lung bases.  No effusion. No pneumothorax.  Nasogastric tube extends at least as far as the stomach, tip not seen.  Heart size upper limits normal for technique.  IMPRESSION:  Relatively low lung volumes with new left infrahilar atelectasis or consolidation.   Original Report Authenticated By: D. Hassell III, MD   Ct Angio Chest Pe W/cm &/or Wo Cm  10/09/2012   *RADIOLOGY REPORT*  Clinical Data: Acute hypoxia; chest pain.  CT ANGIOGRAPHY CHEST  Technique:  Multidetector CT imaging of the chest using the standard protocol during bolus administration of intravenous contrast. Multiplanar reconstructed images including MIPs were obtained and reviewed to evaluate the vascular anatomy.  Contrast: OMNIPAQUE IOHEXOL 350 MG/ML SOLN  Comparison: Chest radiograph performed 10/08/2012  Findings: There is pulmonary embolus within segmental branches to the right middle and lower lung lobes.  There is partial consolidation of the left lower lobe; this appears to reflect debris and fluid filling the bronchus to the left lower lobe, concerning for aspiration pneumonia.  An associated small left pleural effusion is seen.  A trace right pleural effusion is noted.  There is no evidence of pneumothorax.  No masses are identified; no abnormal focal contrast enhancement is seen.  The mediastinum is unremarkable in appearance.  No mediastinal lymphadenopathy is seen.  No pericardial effusion is identified. No axillary lymphadenopathy is seen.  The thyroid gland is unremarkable in appearance.  A small amount of possibly postoperative ascites is noted about the liver.  The visualized portions of the liver and spleen are grossly unremarkable in appearance.  The patient is status post cholecystectomy, with a drainage catheter noted at the gallbladder fossa.  The visualized  portions of the pancreas, adrenal glands and kidneys are grossly unremarkable.  No acute osseous abnormalities are seen.  IMPRESSION:  1.  Pulmonary embolus within segmental branches to the right middle and lower lung lobes. 2.  Partial consolidation of the left lower lung lobe, with debris and fluid filling the bronchus to the left lower lobe, concerning for aspiration pneumonia.  Associated small left pleural effusion seen. 3.  Trace right pleural effusion noted. 4.  Small amount of possibly postoperative ascites noted about the liver, status post cholecystectomy.  Drainage catheter noted at the gallbladder fossa.  Critical Value/emergent results were called by telephone at the time of interpretation on 10/09/2012 at 03:48 a.m. to Dr. Vania Rea, who verbally acknowledged these results.   Original Report Authenticated By: Tonia Ghent, M.D.      Medications: I have reviewed the patient's current medications.  Impression: 1. COPD. 2. Left sided pneumonia and right-sided pulmonary emboli accounting for acute respiratory failure. 3. Status post cholecystectomy .     Plan: 1. Continue with intravenous heparin for the time being. He will require at least 6 months of anticoagulation. We can use xeralto once Dr. Malvin Johns feels it is fairly safe for full anticoagulation to continue. In the meantime intravenous heparin will be appropriate in case there any bleeding problems postoperatively. 2. Continue with intravenous antibiotics. 3. Supplemental oxygen to maintain saturations. 4. Pulmonary consultation. 5. Postoperative surgical management per Dr. Malvin Johns.  Consultants:  Pulmonology pending.   Procedures:  None.   Antibiotics:  Intravenous Rocephin started 10/07/2012. Discontinued 10/09/2012.  Intravenous vancomycin started 10/09/2012.  Intravenous Maxipime started 10/09/2012.                  Code Status: Full code.  Family Communication: Discussed plan with patient at  the bedside.   Disposition Plan: Home when medically stable.  Time spent: 20 minutes.   LOS: 2 days   Wilson Singer Pager [phone]  10/09/2012, 8:09 AM

## 2012-10-09 NOTE — Progress Notes (Signed)
ANTICOAGULATION CONSULT NOTE - Initial Consult  Pharmacy Consult for Heparin Indication: pulmonary embolus  Allergies  Allergen Reactions  . Advair Diskus (Fluticasone-Salmeterol) Swelling    throat    Patient Measurements: Height: 6\' 4"  (193 cm) Weight: 254 lb 10.1 oz (115.5 kg) IBW/kg (Calculated) : 86.8 Heparin Dosing Weight: 109 kg  Vital Signs: Temp: 98.4 F (36.9 C) (08/10 1200) Temp src: Oral (08/10 1200) BP: 131/84 mmHg (08/10 1400) Pulse Rate: 110 (08/10 1425)  Labs:  Recent Labs  10/08/12 0628 10/09/12 0453 10/09/12 1201  HGB 13.2 12.4*  --   HCT 41.6 39.3  --   PLT 177 195  --   APTT  --  33  --   LABPROT  --  14.4  --   INR  --  1.14  --   HEPARINUNFRC  --   --  <0.10*  CREATININE 0.59 0.65  --     Estimated Creatinine Clearance: 157 ml/min (by C-G formula based on Cr of 0.65).   Medical History: Past Medical History  Diagnosis Date  . Asthma   . Pneumonia   . Bronchitis   . COPD (chronic obstructive pulmonary disease)   . Shortness of breath   . Alcoholic pancreatitis     Medications:  Scheduled:  . antiseptic oral rinse  15 mL Mouth Rinse BID  . ceFEPime (MAXIPIME) IV  2 g Intravenous Q8H  . furosemide  20 mg Oral Daily  . vancomycin  1,250 mg Intravenous Q12H    Assessment: Acute respiratory failure Pulmonary emboli of middle and right lower lobe Recent surgery, no bolus Heparin level below goal  Goal of Therapy:  Heparin level 0.3-0.7 units/ml Monitor platelets by anticoagulation protocol: Yes   Plan:  Increase Heparin rate to 2000 Units/hr  Unfractionated heparin level at 9 PM tonight and daily Monitor CBC, platelets   Stanley Thompson 10/09/2012,2:30 PM

## 2012-10-10 ENCOUNTER — Encounter (HOSPITAL_COMMUNITY): Payer: Self-pay | Admitting: General Surgery

## 2012-10-10 ENCOUNTER — Inpatient Hospital Stay (HOSPITAL_COMMUNITY): Payer: Self-pay

## 2012-10-10 DIAGNOSIS — E669 Obesity, unspecified: Secondary | ICD-10-CM

## 2012-10-10 LAB — CBC
MCV: 92.6 fL (ref 78.0–100.0)
Platelets: 180 10*3/uL (ref 150–400)
RDW: 16 % — ABNORMAL HIGH (ref 11.5–15.5)
WBC: 10 10*3/uL (ref 4.0–10.5)

## 2012-10-10 LAB — BLOOD GAS, ARTERIAL
Bicarbonate: 28.5 mEq/L — ABNORMAL HIGH (ref 20.0–24.0)
O2 Saturation: 86.3 %
Patient temperature: 37
TCO2: 25.8 mmol/L (ref 0–100)

## 2012-10-10 LAB — HEPARIN LEVEL (UNFRACTIONATED): Heparin Unfractionated: 0.36 IU/mL (ref 0.30–0.70)

## 2012-10-10 LAB — COMPREHENSIVE METABOLIC PANEL
AST: 16 U/L (ref 0–37)
BUN: 4 mg/dL — ABNORMAL LOW (ref 6–23)
CO2: 30 mEq/L (ref 19–32)
Chloride: 98 mEq/L (ref 96–112)
Creatinine, Ser: 0.55 mg/dL (ref 0.50–1.35)
GFR calc Af Amer: >90 mL/min (ref >90)
GFR calc non Af Amer: >90 mL/min (ref >90)
Glucose, Bld: 113 mg/dL — ABNORMAL HIGH (ref 70–99)
Total Bilirubin: 0.6 mg/dL (ref 0.3–1.2)

## 2012-10-10 MED ORDER — ACETYLCYSTEINE 20 % IN SOLN
2.0000 mL | RESPIRATORY_TRACT | Status: DC
  Administered 2012-10-10 – 2012-10-11 (×10): 2 mL via RESPIRATORY_TRACT
  Administered 2012-10-12: 23:00:00 via RESPIRATORY_TRACT
  Administered 2012-10-12 – 2012-10-13 (×7): 2 mL via RESPIRATORY_TRACT
  Filled 2012-10-10 (×16): qty 4

## 2012-10-10 MED ORDER — ALUM & MAG HYDROXIDE-SIMETH 200-200-20 MG/5ML PO SUSP
30.0000 mL | Freq: Four times a day (QID) | ORAL | Status: DC | PRN
  Administered 2012-10-10: 30 mL via ORAL
  Filled 2012-10-10: qty 30

## 2012-10-10 MED ORDER — ACETYLCYSTEINE 20 % IN SOLN
RESPIRATORY_TRACT | Status: AC
  Filled 2012-10-10: qty 4

## 2012-10-10 MED ORDER — ACETYLCYSTEINE 20 % IN SOLN
RESPIRATORY_TRACT | Status: AC
  Administered 2012-10-10: 800 mg
  Filled 2012-10-10: qty 4

## 2012-10-10 NOTE — Care Management Note (Signed)
    Page 1 of 1   10/14/2012     1:13:07 PM   CARE MANAGEMENT NOTE 10/14/2012  Patient:  Stanley Thompson, Stanley Thompson   Account Number:  [id number]  Date Initiated:  10/10/2012  Documentation initiated by:  Sharrie Rothman  Subjective/Objective Assessment:   Pt admitted from home s/p open choley and then respiratory distress. Pt lives with his wife and will return home at discharge. Pt sees MD at [facility].     Action/Plan:   Financial counselor aware of self pay status. Pt may need Xarelto at discharge and medication assistance forms completed for [employer] for assistance. Will continue to follow for possible need for home O2.   Anticipated DC Date:  10/14/2012   Anticipated DC Plan:  HOME/SELF CARE      DC Planning Services  CM consult      Choice offered to / List presented to:             Status of service:  Completed, signed off Medicare Important Message given?   (If response is "NO", the following Medicare IM given date fields will be blank) Date Medicare IM given:   Date Additional Medicare IM given:    Discharge Disposition:  HOME/SELF CARE  Per UR Regulation:    If discussed at Long Length of Stay Meetings, dates discussed:   10/13/2012    Comments:  10/14/12 1310 Arlyss Queen, RN BSN CM Pt discharging home today. Pt does not qualify for home O2 and pt is refuisng it at this time. Pt has information for Xarelto to give to the pharmacy. No other Cm needs noted.   10/12/12 1505 Arlyss Queen, RN BSN CM Pts Xarelto assistance form faxed to [employer] on 10/11/12. CM called to verify application received and [employer] verified they received it and will have a decision by 10/13/12. Pt still may require home O2. Will call [employer] to see if they can assist with charity for home O2. 10/10/12 1600 Arlyss Queen, RN BSN CM

## 2012-10-10 NOTE — Progress Notes (Signed)
Stanley Thompson XBJ:[id number] DOB: 01-06-1966 DOA: 10/07/2012 PCP: Calla Kicks, MD   Subjective: This man feels better this morning overall. He still is requiring 100% nonrebreather to maintain saturations but his breathing pattern seems to be easier. Thankfully, he has not required mechanical ventilation so far. From the surgical point of view, he seems to be doing well. Since his problems her medical, I have taken over his care as attending of record.           Physical Exam: Blood pressure 138/78, pulse 106, temperature 98.8 F (37.1 C), temperature source Oral, resp. rate 24, height 6\' 4"  (1.93 m), weight 111.3 kg (245 lb 6 oz), SpO2 85.00%.  His saturations are appropriate with 100% nonrebreather. Is not toxic or septic clinically. There is no peripheral central cyanosis. Lung fields bilaterally show some crackles in the left mid and lower zones. There is no bronchial breathing. Heart sounds are present and normal. He remains alert and orientated. There are no focal neurological signs .   Investigations:     Basic Metabolic Panel:  Recent Labs  30/86/57 0453 10/10/12 0530  NA 135 136  K 4.0 3.9  CL 98 98  CO2 29 30  GLUCOSE 114* 113*  BUN 4* 4*  CREATININE 0.65 0.55  CALCIUM 8.9 9.0   Liver Function Tests:  Recent Labs  10/08/12 0628 10/10/12 0530  AST 33 16  ALT 16 10  ALKPHOS 65 60  BILITOT 0.6 0.6  PROT 6.2 6.5  ALBUMIN 2.7* 2.5*     CBC:  Recent Labs  10/09/12 0453 10/10/12 0530  WBC 9.2 10.0  HGB 12.4* 11.8*  HCT 39.3 37.3*  MCV 93.8 92.6  PLT 195 180    Dg Chest 1 View  10/09/2012   *RADIOLOGY REPORT*  Clinical Data: Respiratory failure.  Pulmonary embolism  CHEST - 1 VIEW  Comparison: 10/08/2012  Findings: Progressive left lower lobe atelectasis and infiltrate. There is further volume loss and shift of the heart to the left. Small left effusion.  Right lung is clear.  Negative for heart failure.  IMPRESSION: Progressive  collapse and effusion in the left lower lobe.   Original Report Authenticated By: Janeece Riggers, M.D.   Dg Chest 1 View  10/08/2012   *RADIOLOGY REPORT*  Clinical Data: Congestion post cholecystectomy.  CHEST - 1 VIEW  Comparison: 08/19/2012  Findings: Slightly decreased lung volumes.  New left infrahilar atelectasis or consolidation.  There is some linear subsegmental atelectasis or scarring medially in both lung bases.  No effusion. No pneumothorax.  Nasogastric tube extends at least as far as the stomach, tip not seen.  Heart size upper limits normal for technique.  IMPRESSION:  Relatively low lung volumes with new left infrahilar atelectasis or consolidation.   Original Report Authenticated By: D. Hassell III, MD   Ct Angio Chest Pe W/cm &/or Wo Cm  10/09/2012   *RADIOLOGY REPORT*  Clinical Data: Acute hypoxia; chest pain.  CT ANGIOGRAPHY CHEST  Technique:  Multidetector CT imaging of the chest using the standard protocol during bolus administration of intravenous contrast. Multiplanar reconstructed images including MIPs were obtained and reviewed to evaluate the vascular anatomy.  Contrast: OMNIPAQUE IOHEXOL 350 MG/ML SOLN  Comparison: Chest radiograph performed 10/08/2012  Findings: There is pulmonary embolus within segmental branches to the right middle and lower lung lobes.  There is partial consolidation of the left lower lobe; this appears to reflect debris and fluid filling the bronchus to the left lower lobe, concerning for  aspiration pneumonia.  An associated small left pleural effusion is seen.  A trace right pleural effusion is noted.  There is no evidence of pneumothorax.  No masses are identified; no abnormal focal contrast enhancement is seen.  The mediastinum is unremarkable in appearance.  No mediastinal lymphadenopathy is seen.  No pericardial effusion is identified. No axillary lymphadenopathy is seen.  The thyroid gland is unremarkable in appearance.  A small amount of possibly  postoperative ascites is noted about the liver.  The visualized portions of the liver and spleen are grossly unremarkable in appearance.  The patient is status post cholecystectomy, with a drainage catheter noted at the gallbladder fossa.  The visualized portions of the pancreas, adrenal glands and kidneys are grossly unremarkable.  No acute osseous abnormalities are seen.  IMPRESSION:  1.  Pulmonary embolus within segmental branches to the right middle and lower lung lobes. 2.  Partial consolidation of the left lower lung lobe, with debris and fluid filling the bronchus to the left lower lobe, concerning for aspiration pneumonia.  Associated small left pleural effusion seen. 3.  Trace right pleural effusion noted. 4.  Small amount of possibly postoperative ascites noted about the liver, status post cholecystectomy.  Drainage catheter noted at the gallbladder fossa.  Critical Value/emergent results were called by telephone at the time of interpretation on 10/09/2012 at 03:48 a.m. to Dr. Vania Rea, who verbally acknowledged these results.   Original Report Authenticated By: Tonia Ghent, M.D.   Dg Chest Port 1 View  10/10/2012   *RADIOLOGY REPORT*  Clinical Data: Follow up respiratory failure  PORTABLE CHEST - 1 VIEW  Comparison: Prior chest x-ray 10/09/2012  Findings: Unchanged left basilar consolidation and small effusion. The right lung remains relatively well aerated and clear.  Stable cardiac and mediastinal contours. No acute osseous abnormality.  No pneumothorax.  Slightly less volume loss in the left hemithorax.  IMPRESSION:  Stable consolidation of the left lower lobe with a small associated pleural effusion.  Slightly decreased volume loss in the left chest, otherwise no significant interval change.   Original Report Authenticated By: Malachy Moan, M.D.      Medications: I have reviewed the patient's current medications.  Impression: 1. COPD. 2. Left sided pneumonia and right-sided  pulmonary emboli accounting for acute respiratory failure. 3. Status post cholecystectomy .     Plan: 1. Continue with intravenous heparin for the time being. He will require at least 6 months of anticoagulation. We can use xeralto once Dr. Malvin Johns feels it is fairly safe for full anticoagulation to continue. In the meantime intravenous heparin will be appropriate in case there any bleeding problems postoperatively. 2. Continue with intravenous antibiotics. 3. Supplemental oxygen to maintain saturations. I appreciate Dr. Juanetta Gosling input. 4. Physical therapy evaluation. I think it would be important in this man to maintain muscle function/strength in order for him to recover adequately. 5. Continue to follow ABGs and chest x-ray as necessary.  Consultants:  Pulmonology .   Procedures:  None.   Antibiotics:  Intravenous Rocephin started 10/07/2012. Discontinued 10/09/2012.  Intravenous vancomycin started 10/09/2012.  Intravenous Maxipime started 10/09/2012.                  Code Status: Full code.  Family Communication: Discussed plan with patient at the bedside.   Disposition Plan: Home when medically stable.  Time spent: 20 minutes.   LOS: 3 days   Wilson Singer Pager [phone]  10/10/2012, 8:40 AM

## 2012-10-10 NOTE — Progress Notes (Signed)
Subjective: He says he feels a little bit better. He had more trouble with shortness of breath last night and yesterday afternoon. He was not taking deep breaths or coughing well and is doing better with that. He is now on Mucomyst and that seems to have helped.  Objective: Vital signs in last 24 hours: Temp:  [97.9 F (36.6 C)-99.6 F (37.6 C)] 99.6 F (37.6 C) (08/11 0400) Pulse Rate:  [103-113] 108 (08/11 0630) Resp:  [16-34] 22 (08/11 0630) BP: (103-152)/(66-98) 126/80 mmHg (08/11 0630) SpO2:  [83 %-100 %] 97 % (08/11 0648) FiO2 (%):  [50 %-100 %] 100 % (08/11 0648) Weight:  [111.3 kg (245 lb 6 oz)] 111.3 kg (245 lb 6 oz) (08/11 0500) Weight change: -4.2 kg (-9 lb 4.2 oz) Last BM Date: 10/07/12  Intake/Output from previous day: 08/10 0701 - 08/11 0700 In: 6818.1 [I.V.:6168.1; IV Piggyback:650] Out: 2070 [Urine:1950; Drains:120]  PHYSICAL EXAM General appearance: alert, cooperative and mild distress Resp: rhonchi bilaterally Cardio: regular rate and rhythm, S1, S2 normal, no murmur, click, rub or gallop GI: soft, non-tender; bowel sounds normal; no masses,  no organomegaly Extremities: extremities normal, atraumatic, no cyanosis or edema  Lab Results:    Basic Metabolic Panel:  Recent Labs  65/78/46 0453 10/10/12 0530  NA 135 136  K 4.0 3.9  CL 98 98  CO2 29 30  GLUCOSE 114* 113*  BUN 4* 4*  CREATININE 0.65 0.55  CALCIUM 8.9 9.0   Liver Function Tests:  Recent Labs  10/08/12 0628 10/10/12 0530  AST 33 16  ALT 16 10  ALKPHOS 65 60  BILITOT 0.6 0.6  PROT 6.2 6.5  ALBUMIN 2.7* 2.5*    Recent Labs  10/08/12 0628  LIPASE 21  AMYLASE 22   No results found for this basename: AMMONIA,  in the last 72 hours CBC:  Recent Labs  10/09/12 0453 10/10/12 0530  WBC 9.2 10.0  HGB 12.4* 11.8*  HCT 39.3 37.3*  MCV 93.8 92.6  PLT 195 180   Cardiac Enzymes: No results found for this basename: CKTOTAL, CKMB, CKMBINDEX, TROPONINI,  in the last 72  hours BNP: No results found for this basename: PROBNP,  in the last 72 hours D-Dimer: No results found for this basename: DDIMER,  in the last 72 hours CBG: No results found for this basename: GLUCAP,  in the last 72 hours Hemoglobin A1C: No results found for this basename: HGBA1C,  in the last 72 hours Fasting Lipid Panel: No results found for this basename: CHOL, HDL, LDLCALC, TRIG, CHOLHDL, LDLDIRECT,  in the last 72 hours Thyroid Function Tests: No results found for this basename: TSH, T4TOTAL, FREET4, T3FREE, THYROIDAB,  in the last 72 hours Anemia Panel: No results found for this basename: VITAMINB12, FOLATE, FERRITIN, TIBC, IRON, RETICCTPCT,  in the last 72 hours Coagulation:  Recent Labs  10/09/12 0453  LABPROT 14.4  INR 1.14   Urine Drug Screen: Drugs of Abuse  No results found for this basename: labopia, cocainscrnur, labbenz, amphetmu, thcu, labbarb    Alcohol Level: No results found for this basename: ETH,  in the last 72 hours Urinalysis: No results found for this basename: COLORURINE, APPERANCEUR, LABSPEC, PHURINE, GLUCOSEU, HGBUR, BILIRUBINUR, KETONESUR, PROTEINUR, UROBILINOGEN, NITRITE, LEUKOCYTESUR,  in the last 72 hours Misc. Labs:  ABGS  Recent Labs  10/09/12 1525  PHART 7.436  PO2ART 75.7*  TCO2 24.9  HCO3 27.7*   CULTURES No results found for this or any previous visit (from the past 240  hour(s)). Studies/Results: Dg Chest 1 View  10/09/2012   *RADIOLOGY REPORT*  Clinical Data: Respiratory failure.  Pulmonary embolism  CHEST - 1 VIEW  Comparison: 10/08/2012  Findings: Progressive left lower lobe atelectasis and infiltrate. There is further volume loss and shift of the heart to the left. Small left effusion.  Right lung is clear.  Negative for heart failure.  IMPRESSION: Progressive collapse and effusion in the left lower lobe.   Original Report Authenticated By: Janeece Riggers, M.D.   Dg Chest 1 View  10/08/2012   *RADIOLOGY REPORT*  Clinical Data:  Congestion post cholecystectomy.  CHEST - 1 VIEW  Comparison: 08/19/2012  Findings: Slightly decreased lung volumes.  New left infrahilar atelectasis or consolidation.  There is some linear subsegmental atelectasis or scarring medially in both lung bases.  No effusion. No pneumothorax.  Nasogastric tube extends at least as far as the stomach, tip not seen.  Heart size upper limits normal for technique.  IMPRESSION:  Relatively low lung volumes with new left infrahilar atelectasis or consolidation.   Original Report Authenticated By: D. Hassell III, MD   Ct Angio Chest Pe W/cm &/or Wo Cm  10/09/2012   *RADIOLOGY REPORT*  Clinical Data: Acute hypoxia; chest pain.  CT ANGIOGRAPHY CHEST  Technique:  Multidetector CT imaging of the chest using the standard protocol during bolus administration of intravenous contrast. Multiplanar reconstructed images including MIPs were obtained and reviewed to evaluate the vascular anatomy.  Contrast: OMNIPAQUE IOHEXOL 350 MG/ML SOLN  Comparison: Chest radiograph performed 10/08/2012  Findings: There is pulmonary embolus within segmental branches to the right middle and lower lung lobes.  There is partial consolidation of the left lower lobe; this appears to reflect debris and fluid filling the bronchus to the left lower lobe, concerning for aspiration pneumonia.  An associated small left pleural effusion is seen.  A trace right pleural effusion is noted.  There is no evidence of pneumothorax.  No masses are identified; no abnormal focal contrast enhancement is seen.  The mediastinum is unremarkable in appearance.  No mediastinal lymphadenopathy is seen.  No pericardial effusion is identified. No axillary lymphadenopathy is seen.  The thyroid gland is unremarkable in appearance.  A small amount of possibly postoperative ascites is noted about the liver.  The visualized portions of the liver and spleen are grossly unremarkable in appearance.  The patient is status post  cholecystectomy, with a drainage catheter noted at the gallbladder fossa.  The visualized portions of the pancreas, adrenal glands and kidneys are grossly unremarkable.  No acute osseous abnormalities are seen.  IMPRESSION:  1.  Pulmonary embolus within segmental branches to the right middle and lower lung lobes. 2.  Partial consolidation of the left lower lung lobe, with debris and fluid filling the bronchus to the left lower lobe, concerning for aspiration pneumonia.  Associated small left pleural effusion seen. 3.  Trace right pleural effusion noted. 4.  Small amount of possibly postoperative ascites noted about the liver, status post cholecystectomy.  Drainage catheter noted at the gallbladder fossa.  Critical Value/emergent results were called by telephone at the time of interpretation on 10/09/2012 at 03:48 a.m. to Dr. Vania Rea, who verbally acknowledged these results.   Original Report Authenticated By: Tonia Ghent, M.D.    Medications:  Prior to Admission:  Prescriptions prior to admission  Medication Sig Dispense Refill  . albuterol (PROVENTIL) (2.5 MG/3ML) 0.083% nebulizer solution Take 3 mLs (2.5 mg total) by nebulization every 6 (six) hours as needed for  wheezing. For shortness of breath/difficulty breathing  75 mL  0  . albuterol-ipratropium (COMBIVENT) 18-103 MCG/ACT inhaler Inhale 2 puffs into the lungs every 6 (six) hours as needed for wheezing or shortness of breath.      . folic acid (FOLVITE) 1 MG tablet Take 1 mg by mouth every other day.      . furosemide (LASIX) 20 MG tablet Take 20 mg by mouth daily.      . potassium phosphate, monobasic, (K-PHOS ORIGINAL) 500 MG tablet Take 250 mg by mouth daily.       Scheduled: . acetylcysteine  2 mL Nebulization Q4H  . acetylcysteine      . albuterol  2.5 mg Nebulization Q4H  . antiseptic oral rinse  15 mL Mouth Rinse BID  . ceFEPime (MAXIPIME) IV  2 g Intravenous Q8H  . furosemide  20 mg Oral Daily  . guaiFENesin  1,200 mg  Oral BID  . vancomycin  1,250 mg Intravenous Q12H   Continuous: . 0.9 % NaCl with KCl 20 mEq / L 100 mL/hr at 10/10/12 0600  . heparin 2,400 Units/hr (10/10/12 0553)   RUE:[id number], morphine injection, ondansetron (ZOFRAN) IV, ondansetron  Assesment: He had cholecystectomy which had to be done open. He has had a acute pulmonary embolus and has healthcare hospital acquired pneumonia. He was having difficulty moving his secretions and was not taking deep breaths but is doing much better with that. His chest x-ray today I think is unchanged. He did require 100% oxygen but now is on a simple oxygen mask. Active Problems:   Acute pulmonary embolism   HCAP (healthcare-associated pneumonia)    Plan: I think he is better. Continue with current treatments.    LOS: 3 days   Saulo Anthis L 10/10/2012, 7:55 AM

## 2012-10-10 NOTE — Progress Notes (Signed)
ANTICOAGULATION CONSULT NOTE   Pharmacy Consult for Heparin Indication: pulmonary embolus  Allergies  Allergen Reactions  . Advair Diskus (Fluticasone-Salmeterol) Swelling    throat   Patient Measurements: Height: 6\' 4"  (193 cm) Weight: 245 lb 6 oz (111.3 kg) IBW/kg (Calculated) : 86.8 Heparin Dosing Weight: 109 kg  Vital Signs: Temp: 99.6 F (37.6 C) (08/11 0400) Temp src: Oral (08/11 0400) BP: 126/80 mmHg (08/11 0630) Pulse Rate: 108 (08/11 0630)  Labs:  Recent Labs  10/08/12 0628 10/09/12 0453 10/09/12 1201 10/09/12 2128 10/10/12 0530  HGB 13.2 12.4*  --   --  11.8*  HCT 41.6 39.3  --   --  37.3*  PLT 177 195  --   --  180  APTT  --  33  --   --   --   LABPROT  --  14.4  --   --   --   INR  --  1.14  --   --   --   HEPARINUNFRC  --   --  <0.10* <0.10* 0.12*  CREATININE 0.59 0.65  --   --  0.55    Estimated Creatinine Clearance: 154.3 ml/min (by C-G formula based on Cr of 0.55).  Medical History: Past Medical History  Diagnosis Date  . Asthma   . Pneumonia   . Bronchitis   . COPD (chronic obstructive pulmonary disease)   . Shortness of breath   . Alcoholic pancreatitis    Medications:  Scheduled:  . acetylcysteine  2 mL Nebulization Q4H  . acetylcysteine      . albuterol  2.5 mg Nebulization Q4H  . antiseptic oral rinse  15 mL Mouth Rinse BID  . ceFEPime (MAXIPIME) IV  2 g Intravenous Q8H  . furosemide  20 mg Oral Daily  . guaiFENesin  1,200 mg Oral BID  . vancomycin  1,250 mg Intravenous Q12H   Assessment: Acute respiratory failure Pulmonary emboli of middle and right lower lobe Recent surgery, no bolus Heparin level below goal  Goal of Therapy:  Heparin level 0.3-0.7 units/ml Monitor platelets by anticoagulation protocol: Yes   Plan:  Increase Heparin rate to 2800 Units/hr  Unfractionated heparin level in 6 hours Monitor CBC, platelets  Dawna Jakes A 10/10/2012,7:45 AM

## 2012-10-10 NOTE — Progress Notes (Signed)
POD # 3  Pt appears much more comfortable and able to take deeper breaths.  Still requiring O2 mask 15 L /min with non re breather mask.  Abdomen very soft and wound is clean and redressed and he is tolerating full liquids.  I will D/C drain soon. Serosanguinous drainage noted.No nausea no vomiting and pt is passing flatus and had BM today.  I will advance diet as tolerated.

## 2012-10-10 NOTE — Evaluation (Signed)
Physical Therapy Evaluation Patient Details Name: Stanley Thompson MRN: [id number] DOB: 1966/01/08 Today's Date: 10/10/2012 Time: 8295-6213 PT Time Calculation (min): 43 min  PT Assessment / Plan / Recommendation History of Present Illness  Pt is admitted for elective removal of gallbladder and he has had complication of PE and pneumonia.  He has chronic   COPD and is disabled from this.  He had been active and independent PTA.  Clinical Impression     This is a very pleasant gentleman who was seen for PT evaluation.  He is currently on an O2 rebreather mask for which RN states that any activity causes a significant drop in O2 sat.  In the bed, he was able to participate in therapeutic exercise with no distress.  He still has a drain in gall bladder incision and we were careful to place no stress on this region.  Per RN recommendation, we did not mobilize out of bed at this time, but I do not expect to find any ambulatory problems when we do get him up.  He has been working independently on exercise periodically.  We will follow his progress and increase mobility as able.    PT Assessment  Patient needs continued PT services    Follow Up Recommendations  No PT follow up    Does the patient have the potential to tolerate intense rehabilitation      Barriers to Discharge        Equipment Recommendations  None recommended by PT    Recommendations for Other Services     Frequency Min 3X/week    Precautions / Restrictions Precautions Precautions: None Restrictions Weight Bearing Restrictions: No   Pertinent Vitals/Pain       Mobility  Bed Mobility Bed Mobility: Not assessed    Exercises General Exercises - Lower Extremity Ankle Circles/Pumps: AROM;Both;10 reps;Supine Heel Slides: AROM;Both;10 reps;Supine Hip ABduction/ADduction: AROM;Both;10 reps;Supine Straight Leg Raises: AROM;Both;10 reps;Supine   PT Diagnosis: Difficulty walking  PT Problem List: Decreased  activity tolerance;Cardiopulmonary status limiting activity PT Treatment Interventions: Gait training;Therapeutic activities;Therapeutic exercise;Patient/family education     PT Goals(Current goals can be found in the care plan section) Acute Rehab PT Goals Patient Stated Goal: return home PT Goal Formulation: With patient Time For Goal Achievement: 10/24/12 Potential to Achieve Goals: Good  Visit Information  Last PT Received On: 10/10/12 History of Present Illness: Pt is admitted for elective removal of gallbladder and he has had complication of PE and pneumonia.  He has chronic   COPD and is disabled from this.  He had been active and independent PTA.       Prior Functioning  Home Living Family/patient expects to be discharged to:: Private residence Living Arrangements: Spouse/significant other Available Help at Discharge: Family;Available 24 hours/day Type of Home: House Home Access: Stairs to enter [employer] of Steps: 2 Entrance Stairs-Rails: Right Home Layout: One level Home Equipment: None Prior Function Level of Independence: Independent Communication Communication: No difficulties    Cognition  Cognition Arousal/Alertness: Awake/alert Behavior During Therapy: WFL for tasks assessed/performed Overall Cognitive Status: Within Functional Limits for tasks assessed    Extremity/Trunk Assessment Upper Extremity Assessment Upper Extremity Assessment: Overall WFL for tasks assessed Lower Extremity Assessment Lower Extremity Assessment: Overall WFL for tasks assessed   Balance    End of Session PT - End of Session Equipment Utilized During Treatment: Gait belt Activity Tolerance: Patient tolerated treatment well Patient left: in bed;with call bell/phone within reach;with bed alarm set Nurse Communication: Mobility status  GP     Konrad Penta 10/10/2012, 2:36 PM

## 2012-10-11 LAB — COMPREHENSIVE METABOLIC PANEL
ALT: 9 U/L (ref 0–53)
Alkaline Phosphatase: 66 U/L (ref 39–117)
BUN: 6 mg/dL (ref 6–23)
CO2: 31 mEq/L (ref 19–32)
GFR calc Af Amer: >90 mL/min (ref >90)
GFR calc non Af Amer: >90 mL/min (ref >90)
Glucose, Bld: 103 mg/dL — ABNORMAL HIGH (ref 70–99)
Potassium: 3.4 mEq/L — ABNORMAL LOW (ref 3.5–5.1)
Sodium: 137 mEq/L (ref 135–145)

## 2012-10-11 LAB — BLOOD GAS, ARTERIAL
Acid-Base Excess: 4.2 mmol/L — ABNORMAL HIGH (ref 0.0–2.0)
FIO2: 0.5 %
O2 Saturation: 89.2 %
Patient temperature: 37
TCO2: 26 mmol/L (ref 0–100)

## 2012-10-11 LAB — CBC
HCT: 37.3 % — ABNORMAL LOW (ref 39.0–52.0)
Hemoglobin: 11.7 g/dL — ABNORMAL LOW (ref 13.0–17.0)
MCH: 29.3 pg (ref 26.0–34.0)
RBC: 3.99 MIL/uL — ABNORMAL LOW (ref 4.22–5.81)

## 2012-10-11 LAB — HEPARIN LEVEL (UNFRACTIONATED): Heparin Unfractionated: 0.38 IU/mL (ref 0.30–0.70)

## 2012-10-11 MED ORDER — POTASSIUM CHLORIDE CRYS ER 20 MEQ PO TBCR
40.0000 meq | EXTENDED_RELEASE_TABLET | Freq: Once | ORAL | Status: AC
  Administered 2012-10-11: 40 meq via ORAL
  Filled 2012-10-11: qty 2

## 2012-10-11 NOTE — Progress Notes (Signed)
Stanley Thompson:[id number] DOB: November 08, 1965 DOA: 10/07/2012 PCP: Calla Kicks, MD   Subjective: This man feels better this morning overall. He mobilized well yesterday. His breathing is easier. He is requiring less supplemental oxygen this morning.           Physical Exam: Blood pressure 114/67, pulse 89, temperature 98.1 F (36.7 C), temperature source Oral, resp. rate 14, height 6\' 4"  (1.93 m), weight 111.3 kg (245 lb 6 oz), SpO2 97.00%.  His saturations are appropriate with FiO2 50%.. Is not toxic or septic clinically. There is no peripheral central cyanosis. Lung fields bilaterally show some crackles in the left mid and lower zones. There is no bronchial breathing. Heart sounds are present and normal. He remains alert and orientated. There are no focal neurological signs .   Investigations:     Basic Metabolic Panel:  Recent Labs  81/19/14 0530 10/11/12 0536  NA 136 137  K 3.9 3.4*  CL 98 98  CO2 30 31  GLUCOSE 113* 103*  BUN 4* 6  CREATININE 0.55 0.59  CALCIUM 9.0 8.9   Liver Function Tests:  Recent Labs  10/10/12 0530 10/11/12 0536  AST 16 17  ALT 10 9  ALKPHOS 60 66  BILITOT 0.6 0.5  PROT 6.5 6.7  ALBUMIN 2.5* 2.5*     CBC:  Recent Labs  10/10/12 0530 10/11/12 0536  WBC 10.0 7.1  HGB 11.8* 11.7*  HCT 37.3* 37.3*  MCV 92.6 93.5  PLT 180 192    Dg Chest 1 View  10/09/2012   *RADIOLOGY REPORT*  Clinical Data: Respiratory failure.  Pulmonary embolism  CHEST - 1 VIEW  Comparison: 10/08/2012  Findings: Progressive left lower lobe atelectasis and infiltrate. There is further volume loss and shift of the heart to the left. Small left effusion.  Right lung is clear.  Negative for heart failure.  IMPRESSION: Progressive collapse and effusion in the left lower lobe.   Original Report Authenticated By: Janeece Riggers, M.D.   Dg Chest Port 1 View  10/10/2012   *RADIOLOGY REPORT*  Clinical Data: Follow up respiratory failure  PORTABLE CHEST - 1  VIEW  Comparison: Prior chest x-ray 10/09/2012  Findings: Unchanged left basilar consolidation and small effusion. The right lung remains relatively well aerated and clear.  Stable cardiac and mediastinal contours. No acute osseous abnormality.  No pneumothorax.  Slightly less volume loss in the left hemithorax.  IMPRESSION:  Stable consolidation of the left lower lobe with a small associated pleural effusion.  Slightly decreased volume loss in the left chest, otherwise no significant interval change.   Original Report Authenticated By: Malachy Moan, M.D.      Medications: I have reviewed the patient's current medications.  Impression: 1. COPD. 2. Left sided pneumonia and right-sided pulmonary emboli accounting for acute respiratory failure. 3. Status post cholecystectomy .     Plan: 1. Continue with intravenous heparin for the time being. He will require at least 6 months of anticoagulation. We can use xeralto once Dr. Malvin Johns feels it is fairly safe for full anticoagulation to continue. In the meantime intravenous heparin will be appropriate in case there any bleeding problems postoperatively. 2. Continue with intravenous antibiotics. 3. Supplemental oxygen to maintain saturations. I appreciate Dr. Juanetta Gosling input. 4. Physical therapy evaluation. I think it would be important in this man to maintain muscle function/strength in order for him to recover adequately. 5. Continue to follow ABGs and chest x-ray as necessary. 6. Try to reduce supplemental oxygen to nasal  cannula. If he tolerates this, we could move him to the medical/surgical floor.  Consultants:  Pulmonology .   Procedures:  None.   Antibiotics:  Intravenous Rocephin started 10/07/2012. Discontinued 10/09/2012.  Intravenous vancomycin started 10/09/2012.  Intravenous Maxipime started 10/09/2012.                  Code Status: Full code.  Family Communication: Discussed plan with patient at the bedside.    Disposition Plan: Home when medically stable.  Time spent: 20 minutes.   LOS: 4 days   Wilson Singer Pager [phone]  10/11/2012, 8:03 AM

## 2012-10-11 NOTE — Progress Notes (Signed)
UR chart review completed.  

## 2012-10-11 NOTE — Progress Notes (Signed)
Physical Therapy Treatment Patient Details Name: Stanley Thompson MRN: [id number] DOB: Jun 11, 1965 Today's Date: 10/11/2012 Time: 8295-6213 PT Time Calculation (min): 38 min  PT Assessment / Plan / Recommendation  History of Present Illness     PT Comments   Pt reports feeling better today, but still has right chest pain with coughing.  He is now on nasal cannula at 5 L/min and maintaining O2 sats in the 90s at rest and with exertion.  We were able to mobilize OOB today and he ambulated in the hallway.  He did use a walker for an increased sense of security, but is able to walk with no assistive device.  We can have him use a cane at next visit.   Follow Up Recommendations        Does the patient have the potential to tolerate intense rehabilitation     Barriers to Discharge        Equipment Recommendations       Recommendations for Other Services    Frequency     Progress towards PT Goals Progress towards PT goals: Progressing toward goals  Plan Current plan remains appropriate    Precautions / Restrictions Precautions Precautions: None Restrictions Weight Bearing Restrictions: No   Pertinent Vitals/Pain     Mobility  Bed Mobility Bed Mobility: Sit to Supine;Supine to Sit Supine to Sit: 6: Modified independent (Device/Increase time);HOB elevated;With rails Sit to Supine: 6: Modified independent (Device/Increase time);HOB elevated;With rail Transfers Transfers: Sit to Stand;Stand to Sit Sit to Stand: 6: Modified independent (Device/Increase time);From bed;With upper extremity assist Stand to Sit: 6: Modified independent (Device/Increase time);With upper extremity assist;To bed Ambulation/Gait Ambulation/Gait Assistance: 5: Supervision Ambulation Distance (Feet): 150 Feet Assistive device: Rolling walker Gait Pattern: Within Functional Limits Gait velocity: WNL General Gait Details: pt is able to ambulate without assistive device, but he feels more secure having a  walker there...we may be able to transition to a cane Stairs: No Wheelchair Mobility Wheelchair Mobility: No    Exercises     PT Diagnosis:    PT Problem List:   PT Treatment Interventions:     PT Goals (current goals can now be found in the care plan section)    Visit Information  Last PT Received On: 10/11/12    Subjective Data      Cognition       Balance  Balance Balance Assessed: Yes Dynamic Standing Balance Dynamic Standing - Balance Support: No upper extremity supported Dynamic Standing - Level of Assistance: 6: Modified independent (Device/Increase time)  End of Session PT - End of Session Equipment Utilized During Treatment: Gait belt;Oxygen Activity Tolerance: Patient tolerated treatment well Patient left: in bed;with call bell/phone within reach Nurse Communication: Mobility status   GP     Konrad Penta 10/11/2012, 3:06 PM

## 2012-10-11 NOTE — Progress Notes (Signed)
Filed Vitals:   10/11/12 1600  BP: 125/71  Pulse: 99  Temp:   Resp: 17  Abdomen remains soft.  Had BM today and voiding without dysuria.  Continues to improve from surgical perspective.

## 2012-10-11 NOTE — Progress Notes (Signed)
Subjective: He seems to be getting better. He is still coughing and coughing up some sputum. He still has episodes of hypoxia.  Objective: Vital signs in last 24 hours: Temp:  [98.1 F (36.7 C)-98.5 F (36.9 C)] 98.1 F (36.7 C) (08/12 0426) Pulse Rate:  [87-107] 89 (08/12 0700) Resp:  [12-28] 14 (08/12 0700) BP: (96-136)/(52-82) 114/67 mmHg (08/12 0700) SpO2:  [84 %-100 %] 97 % (08/12 0745) FiO2 (%):  [50 %-100 %] 50 % (08/12 0800) Weight change:  Last BM Date: 10/10/12  Intake/Output from previous day: 08/11 0701 - 08/12 0700 In: 2806.1 [P.O.:480; I.V.:2276.1; IV Piggyback:50] Out: 2080 [Urine:1825; Drains:255]  PHYSICAL EXAM General appearance: alert, cooperative and mild distress Resp: rhonchi bilaterally Cardio: regular rate and rhythm, S1, S2 normal, no murmur, click, rub or gallop GI: soft, non-tender; bowel sounds normal; no masses,  no organomegaly Extremities: extremities normal, atraumatic, no cyanosis or edema  Lab Results:    Basic Metabolic Panel:  Recent Labs  16/10/96 0530 10/11/12 0536  NA 136 137  K 3.9 3.4*  CL 98 98  CO2 30 31  GLUCOSE 113* 103*  BUN 4* 6  CREATININE 0.55 0.59  CALCIUM 9.0 8.9   Liver Function Tests:  Recent Labs  10/10/12 0530 10/11/12 0536  AST 16 17  ALT 10 9  ALKPHOS 60 66  BILITOT 0.6 0.5  PROT 6.5 6.7  ALBUMIN 2.5* 2.5*   No results found for this basename: LIPASE, AMYLASE,  in the last 72 hours No results found for this basename: AMMONIA,  in the last 72 hours CBC:  Recent Labs  10/10/12 0530 10/11/12 0536  WBC 10.0 7.1  HGB 11.8* 11.7*  HCT 37.3* 37.3*  MCV 92.6 93.5  PLT 180 192   Cardiac Enzymes: No results found for this basename: CKTOTAL, CKMB, CKMBINDEX, TROPONINI,  in the last 72 hours BNP: No results found for this basename: PROBNP,  in the last 72 hours D-Dimer: No results found for this basename: DDIMER,  in the last 72 hours CBG: No results found for this basename: GLUCAP,  in the  last 72 hours Hemoglobin A1C: No results found for this basename: HGBA1C,  in the last 72 hours Fasting Lipid Panel: No results found for this basename: CHOL, HDL, LDLCALC, TRIG, CHOLHDL, LDLDIRECT,  in the last 72 hours Thyroid Function Tests: No results found for this basename: TSH, T4TOTAL, FREET4, T3FREE, THYROIDAB,  in the last 72 hours Anemia Panel: No results found for this basename: VITAMINB12, FOLATE, FERRITIN, TIBC, IRON, RETICCTPCT,  in the last 72 hours Coagulation:  Recent Labs  10/09/12 0453  LABPROT 14.4  INR 1.14   Urine Drug Screen: Drugs of Abuse  No results found for this basename: labopia, cocainscrnur, labbenz, amphetmu, thcu, labbarb    Alcohol Level: No results found for this basename: ETH,  in the last 72 hours Urinalysis: No results found for this basename: COLORURINE, APPERANCEUR, LABSPEC, PHURINE, GLUCOSEU, HGBUR, BILIRUBINUR, KETONESUR, PROTEINUR, UROBILINOGEN, NITRITE, LEUKOCYTESUR,  in the last 72 hours Misc. Labs:  ABGS  Recent Labs  10/11/12 0540  PHART 7.400  PO2ART 60.6*  TCO2 26.0  HCO3 28.7*   CULTURES No results found for this or any previous visit (from the past 240 hour(s)). Studies/Results: Dg Chest 1 View  10/09/2012   *RADIOLOGY REPORT*  Clinical Data: Respiratory failure.  Pulmonary embolism  CHEST - 1 VIEW  Comparison: 10/08/2012  Findings: Progressive left lower lobe atelectasis and infiltrate. There is further volume loss and shift of the  heart to the left. Small left effusion.  Right lung is clear.  Negative for heart failure.  IMPRESSION: Progressive collapse and effusion in the left lower lobe.   Original Report Authenticated By: Janeece Riggers, M.D.   Dg Chest Port 1 View  10/10/2012   *RADIOLOGY REPORT*  Clinical Data: Follow up respiratory failure  PORTABLE CHEST - 1 VIEW  Comparison: Prior chest x-ray 10/09/2012  Findings: Unchanged left basilar consolidation and small effusion. The right lung remains relatively well  aerated and clear.  Stable cardiac and mediastinal contours. No acute osseous abnormality.  No pneumothorax.  Slightly less volume loss in the left hemithorax.  IMPRESSION:  Stable consolidation of the left lower lobe with a small associated pleural effusion.  Slightly decreased volume loss in the left chest, otherwise no significant interval change.   Original Report Authenticated By: Malachy Moan, M.D.    Medications:  Prior to Admission:  Prescriptions prior to admission  Medication Sig Dispense Refill  . albuterol (PROVENTIL) (2.5 MG/3ML) 0.083% nebulizer solution Take 3 mLs (2.5 mg total) by nebulization every 6 (six) hours as needed for wheezing. For shortness of breath/difficulty breathing  75 mL  0  . albuterol-ipratropium (COMBIVENT) 18-103 MCG/ACT inhaler Inhale 2 puffs into the lungs every 6 (six) hours as needed for wheezing or shortness of breath.      . folic acid (FOLVITE) 1 MG tablet Take 1 mg by mouth every other day.      . furosemide (LASIX) 20 MG tablet Take 20 mg by mouth daily.      . potassium phosphate, monobasic, (K-PHOS ORIGINAL) 500 MG tablet Take 250 mg by mouth daily.       Scheduled: . acetylcysteine  2 mL Nebulization Q4H  . albuterol  2.5 mg Nebulization Q4H  . antiseptic oral rinse  15 mL Mouth Rinse BID  . ceFEPime (MAXIPIME) IV  2 g Intravenous Q8H  . furosemide  20 mg Oral Daily  . guaiFENesin  1,200 mg Oral BID  . vancomycin  1,250 mg Intravenous Q12H   Continuous: . 0.9 % NaCl with KCl 20 mEq / L 50 mL/hr at 10/10/12 1700  . heparin 2,800 Units/hr (10/11/12 0130)   ZOX:WRUE & mag hydroxide-simeth, Ipratropium-Albuterol, morphine injection, ondansetron (ZOFRAN) IV, ondansetron  Assesment: He had gallbladder surgery and then had acute pulmonary embolism and hospital-acquired pneumonia. He is being treated and is improving. He still has episodes of hypoxia. Active Problems:   Acute pulmonary embolism   HCAP (healthcare-associated  pneumonia)    Plan: Continue current treatments. I agree he could be treated with xarelto when it is safe    LOS: 4 days   Terrah Decoster L 10/11/2012, 8:07 AM

## 2012-10-11 NOTE — Progress Notes (Signed)
POD # 5  Filed Vitals:   10/11/12 1200  BP:   Pulse: 110  Temp:   Resp: 27  BP 129/72, HR 100 O2 sat 93% on 4 L/min nasal canula, 98.6  Clinically Pt appears much better.  Wound is clean and redressed will D/C JP soon.  Will discuss anticoagulation with hospital ist.  Tolerating soft diet without nausea.  Doing well surgically and seems to be improving from respiratory point of view.Marland Kitchen

## 2012-10-11 NOTE — Progress Notes (Signed)
ANTICOAGULATION CONSULT NOTE   Pharmacy Consult for Heparin Indication: pulmonary embolus  Allergies  Allergen Reactions  . Advair Diskus (Fluticasone-Salmeterol) Swelling    throat   Patient Measurements: Height: 6\' 4"  (193 cm) Weight: 245 lb 6 oz (111.3 kg) IBW/kg (Calculated) : 86.8 Heparin Dosing Weight: 109 kg  Vital Signs: Temp: 98.1 F (36.7 C) (08/12 0426) Temp src: Oral (08/12 0426) BP: 114/67 mmHg (08/12 0700) Pulse Rate: 89 (08/12 0700)  Labs:  Recent Labs  10/09/12 0453  10/10/12 0530 10/10/12 1433 10/11/12 0536  HGB 12.4*  --  11.8*  --  11.7*  HCT 39.3  --  37.3*  --  37.3*  PLT 195  --  180  --  192  APTT 33  --   --   --   --   LABPROT 14.4  --   --   --   --   INR 1.14  --   --   --   --   HEPARINUNFRC  --   < > 0.12* 0.36 0.38  CREATININE 0.65  --  0.55  --  0.59  < > = values in this interval not displayed.  Estimated Creatinine Clearance: 154.3 ml/min (by C-G formula based on Cr of 0.59).  Medical History: Past Medical History  Diagnosis Date  . Asthma   . Pneumonia   . Bronchitis   . COPD (chronic obstructive pulmonary disease)   . Shortness of breath   . Alcoholic pancreatitis    Medications:  Scheduled:  . acetylcysteine  2 mL Nebulization Q4H  . albuterol  2.5 mg Nebulization Q4H  . antiseptic oral rinse  15 mL Mouth Rinse BID  . ceFEPime (MAXIPIME) IV  2 g Intravenous Q8H  . furosemide  20 mg Oral Daily  . guaiFENesin  1,200 mg Oral BID  . vancomycin  1,250 mg Intravenous Q12H   Assessment: Acute respiratory failure Pulmonary emboli of middle and right lower lobe Recent surgery, no bolus Heparin level therapeutic now x 2 consecutive checks.  Goal of Therapy:  Heparin level 0.3-0.7 units/ml Monitor platelets by anticoagulation protocol: Yes   Plan:  Continue Heparin at 2800 Units/hr  Unfractionated heparin level daily F/U plans for long term anticoagulation (when OK with surgery) Monitor CBC, platelets  Valrie Hart  A 10/11/2012,8:04 AM

## 2012-10-11 NOTE — Progress Notes (Signed)
ANTICOAGULATION CONSULT NOTE   Pharmacy Consult for Heparin Indication: pulmonary embolus  Allergies  Allergen Reactions  . Advair Diskus (Fluticasone-Salmeterol) Swelling    throat   Patient Measurements: Height: 6\' 4"  (193 cm) Weight: 245 lb 6 oz (111.3 kg) IBW/kg (Calculated) : 86.8 Heparin Dosing Weight: 109 kg  Vital Signs: Temp: 98.3 F (36.8 C) (08/11 2054) Temp src: Oral (08/11 2054) BP: 126/80 mmHg (08/11 2000) Pulse Rate: 94 (08/11 2000)  Labs:  Recent Labs  10/08/12 0628 10/09/12 0453  10/09/12 2128 10/10/12 0530 10/10/12 1433  HGB 13.2 12.4*  --   --  11.8*  --   HCT 41.6 39.3  --   --  37.3*  --   PLT 177 195  --   --  180  --   APTT  --  33  --   --   --   --   LABPROT  --  14.4  --   --   --   --   INR  --  1.14  --   --   --   --   HEPARINUNFRC  --   --   < > <0.10* 0.12* 0.36  CREATININE 0.59 0.65  --   --  0.55  --   < > = values in this interval not displayed.  Estimated Creatinine Clearance: 154.3 ml/min (by C-G formula based on Cr of 0.55).  Medical History: Past Medical History  Diagnosis Date  . Asthma   . Pneumonia   . Bronchitis   . COPD (chronic obstructive pulmonary disease)   . Shortness of breath   . Alcoholic pancreatitis    Medications:  Scheduled:  . acetylcysteine  2 mL Nebulization Q4H  . albuterol  2.5 mg Nebulization Q4H  . antiseptic oral rinse  15 mL Mouth Rinse BID  . ceFEPime (MAXIPIME) IV  2 g Intravenous Q8H  . furosemide  20 mg Oral Daily  . guaiFENesin  1,200 mg Oral BID  . vancomycin  1,250 mg Intravenous Q12H   Assessment: Acute respiratory failure Pulmonary emboli of middle and right lower lobe Recent surgery, no bolus Heparin level therapeutic after rate increase  Goal of Therapy:  Heparin level 0.3-0.7 units/ml Monitor platelets by anticoagulation protocol: Yes   Plan:  Continue Heparin at 2800 Units/hr  Unfractionated heparin level daily Monitor CBC, platelets  Stanley Thompson  A 10/11/2012,12:29 AM

## 2012-10-12 DIAGNOSIS — J9601 Acute respiratory failure with hypoxia: Secondary | ICD-10-CM | POA: Diagnosis not present

## 2012-10-12 DIAGNOSIS — J9600 Acute respiratory failure, unspecified whether with hypoxia or hypercapnia: Secondary | ICD-10-CM

## 2012-10-12 DIAGNOSIS — D649 Anemia, unspecified: Secondary | ICD-10-CM | POA: Diagnosis not present

## 2012-10-12 LAB — CBC
HCT: 35.5 % — ABNORMAL LOW (ref 39.0–52.0)
Hemoglobin: 11.2 g/dL — ABNORMAL LOW (ref 13.0–17.0)
MCH: 29.4 pg (ref 26.0–34.0)
MCHC: 31.5 g/dL (ref 30.0–36.0)
MCV: 93.2 fL (ref 78.0–100.0)

## 2012-10-12 MED ORDER — ACETAMINOPHEN 325 MG PO TABS
650.0000 mg | ORAL_TABLET | Freq: Four times a day (QID) | ORAL | Status: DC | PRN
Start: 2012-10-12 — End: 2012-10-14
  Administered 2012-10-13: 650 mg via ORAL
  Filled 2012-10-12: qty 2

## 2012-10-12 MED ORDER — HYDROCODONE-ACETAMINOPHEN 5-325 MG PO TABS
1.0000 | ORAL_TABLET | Freq: Four times a day (QID) | ORAL | Status: DC | PRN
  Administered 2012-10-12: 2 via ORAL
  Administered 2012-10-12: 1 via ORAL
  Administered 2012-10-13 – 2012-10-14 (×4): 2 via ORAL
  Filled 2012-10-12 (×7): qty 2

## 2012-10-12 NOTE — Progress Notes (Signed)
ANTIBIOTIC CONSULT NOTE  Pharmacy Consult for Vancomycin & Cefepime Indication: pneumonia  Allergies  Allergen Reactions  . Advair Diskus [Fluticasone-Salmeterol] Swelling    throat    Patient Measurements: Height: 6\' 4"  (193 cm) Weight: 251 lb 1.7 oz (113.9 kg) IBW/kg (Calculated) : 86.8   Vital Signs: Temp: 98.3 F (36.8 C) (08/13 0723) Temp src: Oral (08/13 0723) BP: 119/80 mmHg (08/13 0600) Pulse Rate: 89 (08/13 0600) Intake/Output from previous day: 08/12 0701 - 08/13 0700 In: 1774 [P.O.:1020; I.V.:654; IV Piggyback:100] Out: 2905 [Urine:2675; Drains:230] Intake/Output from this shift:    Labs:  Recent Labs  10/10/12 0530 10/11/12 0536 10/12/12 0448  WBC 10.0 7.1 5.2  HGB 11.8* 11.7* 11.2*  PLT 180 192 208  CREATININE 0.55 0.59  --    Estimated Creatinine Clearance: 155.9 ml/min (by C-G formula based on Cr of 0.59). No results found for this basename: VANCOTROUGH, VANCOPEAK, VANCORANDOM, GENTTROUGH, GENTPEAK, GENTRANDOM, TOBRATROUGH, TOBRAPEAK, TOBRARND, AMIKACINPEAK, AMIKACINTROU, AMIKACIN,  in the last 72 hours   Microbiology: No results found for this or any previous visit (from the past 720 hour(s)).  Medical History: Past Medical History  Diagnosis Date  . Asthma   . Pneumonia   . Bronchitis   . COPD (chronic obstructive pulmonary disease)   . Shortness of breath   . Alcoholic pancreatitis     Medications:  Scheduled:  . acetylcysteine  2 mL Nebulization Q4H  . albuterol  2.5 mg Nebulization Q4H  . antiseptic oral rinse  15 mL Mouth Rinse BID  . ceFEPime (MAXIPIME) IV  2 g Intravenous Q8H  . furosemide  20 mg Oral Daily  . guaiFENesin  1,200 mg Oral BID  . vancomycin  1,250 mg Intravenous Q12H   Assessment: 47 yo obese M on day#4 Vancomycin & Cefepime for HCA vs. aspiration PNA. He is clinically improving.   Renal function has been stable at patient's baseline.   Goal of Therapy:  Vancomycin trough level 15-20 mcg/ml  Plan:   Vancomycin 1250 mg IV every 12 hours Cefepime 2 GM IV every 8 hours Check Vancomycin trough with tomorrow's dose Monitor renal function Duration of therapy per MD- de-escalate abx as appropriate  Zaidy Absher, [facility] 10/12/2012,8:21 AM

## 2012-10-12 NOTE — Progress Notes (Signed)
Subjective: He is awake and alert and says he feels well. He was able to get up and move around yesterday. He is still coughing up purulent sputum. He feels like he can get deeper breaths and has less pain.  Objective: Vital signs in last 24 hours: Temp:  [98.1 F (36.7 C)-99 F (37.2 C)] 98.3 F (36.8 C) (08/13 0723) Pulse Rate:  [85-110] 89 (08/13 0600) Resp:  [13-27] 14 (08/13 0600) BP: (113-136)/(65-87) 119/80 mmHg (08/13 0600) SpO2:  [89 %-97 %] 93 % (08/13 0600) Weight:  [113.9 kg (251 lb 1.7 oz)] 113.9 kg (251 lb 1.7 oz) (08/13 0500) Weight change:  Last BM Date: 10/11/12  Intake/Output from previous day: 08/12 0701 - 08/13 0700 In: 1774 [P.O.:1020; I.V.:654; IV Piggyback:100] Out: 2905 [Urine:2675; Drains:230]  PHYSICAL EXAM General appearance: alert, cooperative and no distress Resp: rhonchi bilaterally Cardio: regular rate and rhythm, S1, S2 normal, no murmur, click, rub or gallop GI: soft, non-tender; bowel sounds normal; no masses,  no organomegaly Extremities: extremities normal, atraumatic, no cyanosis or edema  Lab Results:    Basic Metabolic Panel:  Recent Labs  40/98/11 0530 10/11/12 0536  NA 136 137  K 3.9 3.4*  CL 98 98  CO2 30 31  GLUCOSE 113* 103*  BUN 4* 6  CREATININE 0.55 0.59  CALCIUM 9.0 8.9   Liver Function Tests:  Recent Labs  10/10/12 0530 10/11/12 0536  AST 16 17  ALT 10 9  ALKPHOS 60 66  BILITOT 0.6 0.5  PROT 6.5 6.7  ALBUMIN 2.5* 2.5*   No results found for this basename: LIPASE, AMYLASE,  in the last 72 hours No results found for this basename: AMMONIA,  in the last 72 hours CBC:  Recent Labs  10/11/12 0536 10/12/12 0448  WBC 7.1 5.2  HGB 11.7* 11.2*  HCT 37.3* 35.5*  MCV 93.5 93.2  PLT 192 208   Cardiac Enzymes: No results found for this basename: CKTOTAL, CKMB, CKMBINDEX, TROPONINI,  in the last 72 hours BNP: No results found for this basename: PROBNP,  in the last 72 hours D-Dimer: No results found for  this basename: DDIMER,  in the last 72 hours CBG: No results found for this basename: GLUCAP,  in the last 72 hours Hemoglobin A1C: No results found for this basename: HGBA1C,  in the last 72 hours Fasting Lipid Panel: No results found for this basename: CHOL, HDL, LDLCALC, TRIG, CHOLHDL, LDLDIRECT,  in the last 72 hours Thyroid Function Tests: No results found for this basename: TSH, T4TOTAL, FREET4, T3FREE, THYROIDAB,  in the last 72 hours Anemia Panel: No results found for this basename: VITAMINB12, FOLATE, FERRITIN, TIBC, IRON, RETICCTPCT,  in the last 72 hours Coagulation: No results found for this basename: LABPROT, INR,  in the last 72 hours Urine Drug Screen: Drugs of Abuse  No results found for this basename: labopia, cocainscrnur, labbenz, amphetmu, thcu, labbarb    Alcohol Level: No results found for this basename: ETH,  in the last 72 hours Urinalysis: No results found for this basename: COLORURINE, APPERANCEUR, LABSPEC, PHURINE, GLUCOSEU, HGBUR, BILIRUBINUR, KETONESUR, PROTEINUR, UROBILINOGEN, NITRITE, LEUKOCYTESUR,  in the last 72 hours Misc. Labs:  ABGS  Recent Labs  10/11/12 0540  PHART 7.400  PO2ART 60.6*  TCO2 26.0  HCO3 28.7*   CULTURES No results found for this or any previous visit (from the past 240 hour(s)). Studies/Results: No results found.  Medications:  Scheduled: . acetylcysteine  2 mL Nebulization Q4H  . albuterol  2.5 mg  Nebulization Q4H  . antiseptic oral rinse  15 mL Mouth Rinse BID  . ceFEPime (MAXIPIME) IV  2 g Intravenous Q8H  . furosemide  20 mg Oral Daily  . guaiFENesin  1,200 mg Oral BID  . vancomycin  1,250 mg Intravenous Q12H   Continuous: . 0.9 % NaCl with KCl 20 mEq / Thompson 10 mL/hr (10/11/12 0821)  . heparin 2,800 Units/hr (10/12/12 6213)   YQM:VHQI & mag hydroxide-simeth, Ipratropium-Albuterol, morphine injection, ondansetron (ZOFRAN) IV, ondansetron  Assesment: He had gallbladder surgery it was complicated by pulmonary  embolus and hospital-acquired pneumonia. He is improving. His oxygenation is much better and he is on nasal cannula at 2-3 Thompson and maintaining O2 sats in the low to mid 90s. Active Problems:   Acute pulmonary embolism   HCAP (healthcare-associated pneumonia)    Plan: Continue current treatments.    LOS: 5 days   Stanley Thompson 10/12/2012, 8:12 AM

## 2012-10-12 NOTE — Progress Notes (Signed)
PT Cancellation Note  Patient Details Name: DELMUS WARWICK MRN: [id number] DOB: 10/18/1965   Cancelled Treatment:      Nurse states she has walked patient twice around ICU with no assistive device and no problems.   Pt no longer in need of therapy will discharge from services.   RUSSELL,CINDY 10/12/2012, 11:26 AM

## 2012-10-12 NOTE — Progress Notes (Addendum)
[facility] PROGRESS NOTE  Stanley Thompson:[id number] DOB: Jul 27, 1965 DOA: 10/07/2012 PCP: Calla Kicks, MD  Brief narrative 47 year old male with history of tobacco abuse, COPD, alcohol abuse & alcoholic pancreatitis had elective open cholecystectomy for cholelithiasis on 10/07/12.[facility] initially consulted on 8/8 for medical management of COPD. On 8/9 he had worsening dyspnea and hypoxia. CT angiogram showed acute PE in RLL and pneumonia in LLL-possibly HCAP/aspiration. Patient was transferred to ICU and started on IV heparin, vancomycin and cefepime. He was transferred to the hospitalist service. Surgery and pulmonology continue to consult.  Assessment/Plan: 1. Acute hypoxic respiratory failure: Secondary to acute PE, COPD and pneumonia. Clinically improving. Down to 2 L per minute via nasal cannula oxygen - saturating in low 90's. 2. Acute PE-RML & RLL (postoperative): Currently on IV heparin infusion. Transition to Xarelto when cleared by surgeons. He will require at least 6 months of anticoagulation. Hemodynamically stable. 3. Pneumonia- HCAP vs aspiration: Improving. Continue IV vancomycin and Maxipime. Encouraged spirometer use. Consider changing to PO Abx in 24-48 hours. No cultures drawn. 4. Anemia: stable. 5. Hypokalemia: follow BMP in am. 6. History of Alcohol abuse: has quit. 7. Tobacco Abuse: cessation counseled. 8. COPD: Stable  Code Status: Full Family Communication: None Disposition Plan: Change to SDU    Consultants:  Surgery  Pulmonology  Procedures:  Open cholecystectomy 10/07/12  Antibiotics:  IV Rocephin 8/8 > 8/10  IV vancomycin 8/10 >  IV Maxipime 8/10 >   HPI/Subjective: Feels better. No dyspnea. Intermittent mild right lower pleuritic chest pain. Tolerating soft diet. Had a BM's. Pain mild and controlled. Ambulating in room.  Objective: Filed Vitals:   10/12/12 0600 10/12/12 0700 10/12/12 0723 10/12/12 0800  BP: 119/80 129/80  135/83   Pulse: 89 85  93  Temp:   98.3 F (36.8 C)   TempSrc:   Oral   Resp: 14 20  15   Height:      Weight:      SpO2: 93% 95%  93%    Intake/Output Summary (Last 24 hours) at 10/12/12 0925 Last data filed at 10/12/12 0900  Gross per 24 hour  Intake   2412 ml  Output   2715 ml  Net   -303 ml   Filed Weights   10/09/12 0444 10/10/12 0500 10/12/12 0500  Weight: 115.5 kg (254 lb 10.1 oz) 111.3 kg (245 lb 6 oz) 113.9 kg (251 lb 1.7 oz)    Exam:   General exam: Comfortable.  Respiratory system: Reduced breath sounds in the bases but otherwise clear to auscultation. No increased work of breathing. No pleural rub, crackles, wheezing or rhonchi.  Cardiovascular system: S1 & S2 heard, RRR. No JVD, murmurs, gallops, clicks or pedal edema. Telemetry: Sinus rhythm.  Gastrointestinal system: Abdomen is nondistended, soft and nontender. Normal bowel sounds heard. Surgical site dressing-clean and dry. Still has JP drain.  Central nervous system: Alert and oriented. No focal neurological deficits.  Extremities: Symmetric 5 x 5 power.   Data Reviewed: Basic Metabolic Panel:  Recent Labs Lab 10/08/12 0628 10/09/12 0453 10/10/12 0530 10/11/12 0536  NA 136 135 136 137  K 4.4 4.0 3.9 3.4*  CL 100 98 98 98  CO2 27 29 30 31   GLUCOSE 106* 114* 113* 103*  BUN 4* 4* 4* 6  CREATININE 0.59 0.65 0.55 0.59  CALCIUM 8.9 8.9 9.0 8.9   Liver Function Tests:  Recent Labs Lab 10/08/12 0628 10/10/12 0530 10/11/12 0536  AST 33 16 17  ALT 16 10 9  ALKPHOS 65 60 66  BILITOT 0.6 0.6 0.5  PROT 6.2 6.5 6.7  ALBUMIN 2.7* 2.5* 2.5*    Recent Labs Lab 10/08/12 0628  LIPASE 21  AMYLASE 22   No results found for this basename: AMMONIA,  in the last 168 hours CBC:  Recent Labs Lab 10/08/12 0628 10/09/12 0453 10/10/12 0530 10/11/12 0536 10/12/12 0448  WBC 8.4 9.2 10.0 7.1 5.2  HGB 13.2 12.4* 11.8* 11.7* 11.2*  HCT 41.6 39.3 37.3* 37.3* 35.5*  MCV 94.1 93.8 92.6 93.5 93.2  PLT 177  195 180 192 208   Cardiac Enzymes: No results found for this basename: CKTOTAL, CKMB, CKMBINDEX, TROPONINI,  in the last 168 hours BNP (last 3 results)  Recent Labs  08/19/12 1318  PROBNP 43.1   CBG: No results found for this basename: GLUCAP,  in the last 168 hours  No results found for this or any previous visit (from the past 240 hour(s)).   Studies: No results found.   Additional labs:   Scheduled Meds: . acetylcysteine  2 mL Nebulization Q4H  . albuterol  2.5 mg Nebulization Q4H  . antiseptic oral rinse  15 mL Mouth Rinse BID  . ceFEPime (MAXIPIME) IV  2 g Intravenous Q8H  . furosemide  20 mg Oral Daily  . guaiFENesin  1,200 mg Oral BID  . vancomycin  1,250 mg Intravenous Q12H   Continuous Infusions: . 0.9 % NaCl with KCl 20 mEq / L 10 mL/hr (10/11/12 0821)  . heparin 2,800 Units/hr (10/12/12 4098)    Active Problems:   Acute pulmonary embolism   HCAP (healthcare-associated pneumonia)    Time spent: 25 minutes.    [facility]  [facility] Pager [phone].   If 8PM-8AM, please contact night-coverage at [email or web address], password [facility] 10/12/2012, 9:25 AM  LOS: 5 days

## 2012-10-12 NOTE — Progress Notes (Signed)
POD # 5  Filed Vitals:   10/12/12 1300  BP:   Pulse: 91  Temp:   Resp: 15  BP 128/81 temp. 98.3  Pulse 85  O2 sat 92% RA. Abdomen is benign.  Tolerating solid diet without nausea  and moving bowels.  Wouns is very clean and there is just serous drainage in JP, will remove today.  Con't care as per med and respiratory services.  No surgical problems.

## 2012-10-12 NOTE — Progress Notes (Signed)
Pt ambulated in hallway greater than 500 feet with RN with monitor on. First lap around nurses station, pts HR 100-110, and O2 > 90% on 2 liters [place]. Second lap around nurses station HR 100-100, O2 > 90% on RA.  Pt tolerated well. No complaints. Pt returned to room and is sitting up in chair. Pt currently on RA and sats 90-93%.  Will continue to monitor.

## 2012-10-12 NOTE — Progress Notes (Signed)
ANTICOAGULATION CONSULT NOTE   Pharmacy Consult for Heparin Indication: pulmonary embolus  Allergies  Allergen Reactions  . Advair Diskus [Fluticasone-Salmeterol] Swelling    throat   Patient Measurements: Height: 6\' 4"  (193 cm) Weight: 251 lb 1.7 oz (113.9 kg) IBW/kg (Calculated) : 86.8 Heparin Dosing Weight: 109 kg  Vital Signs: Temp: 98.3 F (36.8 C) (08/13 0723) Temp src: Oral (08/13 0723) BP: 119/80 mmHg (08/13 0600) Pulse Rate: 89 (08/13 0600)  Labs:  Recent Labs  10/10/12 0530 10/10/12 1433 10/11/12 0536 10/12/12 0448  HGB 11.8*  --  11.7* 11.2*  HCT 37.3*  --  37.3* 35.5*  PLT 180  --  192 208  HEPARINUNFRC 0.12* 0.36 0.38 0.30  CREATININE 0.55  --  0.59  --     Estimated Creatinine Clearance: 155.9 ml/min (by C-G formula based on Cr of 0.59).  Medical History: Past Medical History  Diagnosis Date  . Asthma   . Pneumonia   . Bronchitis   . COPD (chronic obstructive pulmonary disease)   . Shortness of breath   . Alcoholic pancreatitis    Medications:  Scheduled:  . acetylcysteine  2 mL Nebulization Q4H  . albuterol  2.5 mg Nebulization Q4H  . antiseptic oral rinse  15 mL Mouth Rinse BID  . ceFEPime (MAXIPIME) IV  2 g Intravenous Q8H  . furosemide  20 mg Oral Daily  . guaiFENesin  1,200 mg Oral BID  . vancomycin  1,250 mg Intravenous Q12H   Assessment: 47 yo M s/p open cholecystectomy with +PE post-op.  Currently on IV heparin with therapeutic heparin level.  No bleeding noted.  Awaiting surgery approval to transition to oral anticoagulant.   Goal of Therapy:  Heparin level 0.3-0.7 units/ml Monitor platelets by anticoagulation protocol: Yes   Plan:  Continue Heparin at 2800 Units/hr  Unfractionated heparin level daily F/U plans for long term anticoagulation (when OK with surgery) Monitor CBC, platelets  Elson Clan 10/12/2012,8:27 AM

## 2012-10-13 LAB — CBC
HCT: 34.9 % — ABNORMAL LOW (ref 39.0–52.0)
MCH: 29.5 pg (ref 26.0–34.0)
MCV: 91.8 fL (ref 78.0–100.0)
RDW: 15.4 % (ref 11.5–15.5)
WBC: 4.1 10*3/uL (ref 4.0–10.5)

## 2012-10-13 LAB — BASIC METABOLIC PANEL
BUN: 5 mg/dL — ABNORMAL LOW (ref 6–23)
Chloride: 102 mEq/L (ref 96–112)
Creatinine, Ser: 0.57 mg/dL (ref 0.50–1.35)
GFR calc Af Amer: >90 mL/min (ref >90)

## 2012-10-13 MED ORDER — RIVAROXABAN 10 MG PO TABS
15.0000 mg | ORAL_TABLET | Freq: Two times a day (BID) | ORAL | Status: DC
  Administered 2012-10-13 – 2012-10-14 (×2): 15 mg via ORAL
  Filled 2012-10-13: qty 2
  Filled 2012-10-13: qty 1
  Filled 2012-10-13: qty 2

## 2012-10-13 MED ORDER — RIVAROXABAN 10 MG PO TABS: 20.0000 mg | ORAL_TABLET | Freq: Every day | ORAL | Status: DC

## 2012-10-13 MED ORDER — ALBUTEROL SULFATE (5 MG/ML) 0.5% IN NEBU
2.5000 mg | INHALATION_SOLUTION | RESPIRATORY_TRACT | Status: DC | PRN
  Filled 2012-10-13: qty 0.5

## 2012-10-13 MED ORDER — LEVOFLOXACIN 750 MG PO TABS
750.0000 mg | ORAL_TABLET | Freq: Every day | ORAL | Status: DC
  Administered 2012-10-13 – 2012-10-14 (×2): 750 mg via ORAL
  Filled 2012-10-13 (×2): qty 1

## 2012-10-13 MED ORDER — SENNA 8.6 MG PO TABS
2.0000 | ORAL_TABLET | Freq: Every day | ORAL | Status: DC
  Administered 2012-10-13 – 2012-10-14 (×2): 17.2 mg via ORAL
  Filled 2012-10-13: qty 2
  Filled 2012-10-13: qty 1

## 2012-10-13 MED ORDER — IPRATROPIUM-ALBUTEROL 20-100 MCG/ACT IN AERS
1.0000 | INHALATION_SPRAY | Freq: Four times a day (QID) | RESPIRATORY_TRACT | Status: DC
  Administered 2012-10-13 – 2012-10-14 (×3): 1 via RESPIRATORY_TRACT

## 2012-10-13 MED ORDER — FOLIC ACID 1 MG PO TABS
1.0000 mg | ORAL_TABLET | ORAL | Status: DC
  Administered 2012-10-13: 1 mg via ORAL
  Filled 2012-10-13: qty 1

## 2012-10-13 NOTE — Progress Notes (Signed)
Subjective: He says he is much improved. He was able to walk in the unit yesterday and did not have any hypoxia while he was walking. However he does become hypoxic during sleep. He is still coughing but not as much. He complains of some pleuritic chest pain  Objective: Vital signs in last 24 hours: Temp:  [98.7 F (37.1 C)-100.1 F (37.8 C)] 98.9 F (37.2 C) (08/14 0800) Pulse Rate:  [37-101] 92 (08/14 0800) Resp:  [11-23] 18 (08/14 0800) BP: (128-163)/(81-128) 143/89 mmHg (08/14 0800) SpO2:  [85 %-98 %] 96 % (08/14 0800) Weight:  [110.5 kg (243 lb 9.7 oz)] 110.5 kg (243 lb 9.7 oz) (08/14 0500) Weight change: -3.4 kg (-7 lb 7.9 oz) Last BM Date: 10/12/12  Intake/Output from previous day: 08/13 0701 - 08/14 0700 In: 2496 [P.O.:960; I.V.:836; IV Piggyback:700] Out: 1410 [Urine:1350; Drains:60]  PHYSICAL EXAM General appearance: alert, cooperative and no distress Resp: rhonchi anterior - left Cardio: regular rate and rhythm, S1, S2 normal, no murmur, click, rub or gallop GI: soft, non-tender; bowel sounds normal; no masses,  no organomegaly Extremities: extremities normal, atraumatic, no cyanosis or edema  Lab Results:    Basic Metabolic Panel:  Recent Labs  16/10/96 0536 10/13/12 0502  NA 137 139  K 3.4* 3.5  CL 98 102  CO2 31 28  GLUCOSE 103* 103*  BUN 6 5*  CREATININE 0.59 0.57  CALCIUM 8.9 9.1   Liver Function Tests:  Recent Labs  10/11/12 0536  AST 17  ALT 9  ALKPHOS 66  BILITOT 0.5  PROT 6.7  ALBUMIN 2.5*   No results found for this basename: LIPASE, AMYLASE,  in the last 72 hours No results found for this basename: AMMONIA,  in the last 72 hours CBC:  Recent Labs  10/12/12 0448 10/13/12 0502  WBC 5.2 4.1  HGB 11.2* 11.2*  HCT 35.5* 34.9*  MCV 93.2 91.8  PLT 208 143*   Cardiac Enzymes: No results found for this basename: CKTOTAL, CKMB, CKMBINDEX, TROPONINI,  in the last 72 hours BNP: No results found for this basename: PROBNP,  in the  last 72 hours D-Dimer: No results found for this basename: DDIMER,  in the last 72 hours CBG: No results found for this basename: GLUCAP,  in the last 72 hours Hemoglobin A1C: No results found for this basename: HGBA1C,  in the last 72 hours Fasting Lipid Panel: No results found for this basename: CHOL, HDL, LDLCALC, TRIG, CHOLHDL, LDLDIRECT,  in the last 72 hours Thyroid Function Tests: No results found for this basename: TSH, T4TOTAL, FREET4, T3FREE, THYROIDAB,  in the last 72 hours Anemia Panel: No results found for this basename: VITAMINB12, FOLATE, FERRITIN, TIBC, IRON, RETICCTPCT,  in the last 72 hours Coagulation: No results found for this basename: LABPROT, INR,  in the last 72 hours Urine Drug Screen: Drugs of Abuse  No results found for this basename: labopia, cocainscrnur, labbenz, amphetmu, thcu, labbarb    Alcohol Level: No results found for this basename: ETH,  in the last 72 hours Urinalysis: No results found for this basename: COLORURINE, APPERANCEUR, LABSPEC, PHURINE, GLUCOSEU, HGBUR, BILIRUBINUR, KETONESUR, PROTEINUR, UROBILINOGEN, NITRITE, LEUKOCYTESUR,  in the last 72 hours Misc. Labs:  ABGS  Recent Labs  10/11/12 0540  PHART 7.400  PO2ART 60.6*  TCO2 26.0  HCO3 28.7*   CULTURES No results found for this or any previous visit (from the past 240 hour(s)). Studies/Results: No results found.  Medications:  Scheduled: . acetylcysteine  2 mL Nebulization  Q4H  . albuterol  2.5 mg Nebulization Q4H  . antiseptic oral rinse  15 mL Mouth Rinse BID  . ceFEPime (MAXIPIME) IV  2 g Intravenous Q8H  . furosemide  20 mg Oral Daily  . guaiFENesin  1,200 mg Oral BID  . vancomycin  1,250 mg Intravenous Q12H   Continuous: . 0.9 % NaCl with KCl 20 mEq / L 10 mL/hr (10/12/12 1516)  . heparin 2,800 Units/hr (10/12/12 2229)   GNF:AOZHYQMVHQION, alum & mag hydroxide-simeth, HYDROcodone-acetaminophen, Ipratropium-Albuterol, morphine injection, ondansetron (ZOFRAN) IV,  ondansetron  Assesment: He was admitted for gallbladder surgery initially planned for laparoscopic procedure but it turned out he had to have an open procedure. He had complications of hospital-acquired pneumonia which I believe was probably aspiration based on the findings on CT, acute respiratory failure with hypoxia and acute pulmonary embolism. He is markedly improved. Active Problems:   COPD (chronic obstructive pulmonary disease)   Hypokalemia   Acute pulmonary embolism   HCAP (healthcare-associated pneumonia)   Acute respiratory failure with hypoxia   Anemia    Plan: I discussed with hospitalist attending and agree he can probably move he could be placed on oral antibiotics and okay to start on xarelto when okay with surgery he will need chest x-ray in 4-6 weeks to document total clearing of his pneumonia and another chest x-ray here in the hospital may be helpful. I will plan to follow more peripherally.  Thanks for allowing me to see him with you    LOS: 6 days   Stanley Thompson L 10/13/2012, 8:33 AM

## 2012-10-13 NOTE — Progress Notes (Addendum)
ANTICOAGULATION CONSULT NOTE   Pharmacy Consult for Heparin Indication: pulmonary embolus  Allergies  Allergen Reactions  . Advair Diskus [Fluticasone-Salmeterol] Swelling    throat   Patient Measurements: Height: 6\' 4"  (193 cm) Weight: 243 lb 9.7 oz (110.5 kg) IBW/kg (Calculated) : 86.8 Heparin Dosing Weight: 109 kg  Vital Signs: Temp: 99.2 F (37.3 C) (08/14 0400) Temp src: Oral (08/14 0400) BP: 163/128 mmHg (08/14 0400) Pulse Rate: 76 (08/14 0600)  Labs:  Recent Labs  10/11/12 0536 10/12/12 0448 10/13/12 0502  HGB 11.7* 11.2* 11.2*  HCT 37.3* 35.5* 34.9*  PLT 192 208 143*  HEPARINUNFRC 0.38 0.30 0.38  CREATININE 0.59  --  0.57    Estimated Creatinine Clearance: 153.8 ml/min (by C-G formula based on Cr of 0.57).  Medical History: Past Medical History  Diagnosis Date  . Asthma   . Pneumonia   . Bronchitis   . COPD (chronic obstructive pulmonary disease)   . Shortness of breath   . Alcoholic pancreatitis    Medications:  Scheduled:  . acetylcysteine  2 mL Nebulization Q4H  . albuterol  2.5 mg Nebulization Q4H  . antiseptic oral rinse  15 mL Mouth Rinse BID  . ceFEPime (MAXIPIME) IV  2 g Intravenous Q8H  . furosemide  20 mg Oral Daily  . guaiFENesin  1,200 mg Oral BID  . vancomycin  1,250 mg Intravenous Q12H   Assessment: 47 yo M s/p open cholecystectomy with +PE post-op.  Currently on IV heparin with therapeutic heparin level.  No bleeding noted.  Awaiting surgery approval to transition to oral anticoagulant.   Goal of Therapy:  Heparin level 0.3-0.7 units/ml Monitor platelets by anticoagulation protocol: Yes   Plan:  Continue Heparin at 2800 Units/hr  Unfractionated heparin level & CBC daily F/U plans for long term anticoagulation (when OK with surgery)  Elson Clan 10/13/2012,7:42 AM  Patient discussed with Dr Waymon Amato.  Will d/c heparin tonight & start Xarelto. Plan:  Xarelto 15mg  po bid x 3 weeks followed by Xarelto 20mg   daily for treatment duration (~6 months). Patient education.   Junita Push, PharmD, BCPS 10/13/2012@12 :06 PM

## 2012-10-13 NOTE — Progress Notes (Signed)
POD # 6  Filed Vitals:   10/13/12 0900  BP:   Pulse: 100  Temp:   Resp: 16  BP 143/89  Afebrile, 98.9  Abdomen is completely soft no GI complaints.  Doing well post op from surgical point of view.  He continues to be monitored from the respiratory point of view and will be discharged as per med service.  I will likely reove his surgical clips prior to discharge.

## 2012-10-13 NOTE — Progress Notes (Signed)
[facility] PROGRESS NOTE  Stanley Thompson UJW:[id number] DOB: 1965-09-19 DOA: 10/07/2012 PCP: Calla Kicks, MD  Brief narrative 47 year old male with history of tobacco abuse, COPD, alcohol abuse & alcoholic pancreatitis had elective open cholecystectomy for cholelithiasis on 10/07/12.[facility] initially consulted on 8/8 for medical management of COPD. On 8/9 he had worsening dyspnea and hypoxia. CT angiogram showed acute PE in RLL and pneumonia in LLL-possibly HCAP/aspiration. Patient was transferred to ICU and started on IV heparin, vancomycin and cefepime. He was transferred to the hospitalist service. Surgery and pulmonology continue to consult.  Assessment/Plan: 1. Acute hypoxic respiratory failure: Secondary to acute PE, COPD and pneumonia. Clinically improving. Saturated in the low 90s without oxygen with activity on 8/13 but subsequently had saturations 86-88% at rest/sleep overnight. Overall improving. We'll need to reassess oxygenation prior to DC. Check chest x-ray in a.m. 2. Acute PE-RML & RLL (postoperative): Currently on IV heparin infusion. He will require at least 6 months of anticoagulation. Hemodynamically stable. D/W Dr. Malvin Johns who defers anticoagulation decisions & DC timing to the medical team. Will start Xarelto- approved for 1 year free supply. Discussed with Dr. Juanetta Gosling- agrees. 3. Pneumonia- HCAP vs aspiration: Improving. Continue IV vancomycin and Maxipime. Encouraged spirometer use. Consider changing to PO Abx in 24-48 hours. No cultures drawn. Changed to oral Levaquin to complete total 8 days treatment. We'll 8 followup chest x-ray in 4-6 weeks to ensure resolution 4. Anemia: stable. 5. Status post open cholecystectomy on 10/07/12. Discussed with surgery. JP drain removed. Staples will be removed on 8/15 and cleared for discharge from surgical standpoint. 6. Hypokalemia: Corrected 7. History of Alcohol abuse: has quit. 8. Tobacco Abuse: cessation counseled. 9. COPD:  Stable  Code Status: Full Family Communication: None Disposition Plan: Transfer to medical bed.   Consultants:  Surgery  Pulmonology  Procedures:  Open cholecystectomy 10/07/12  Antibiotics:  IV Rocephin 8/8 > 8/10  IV vancomycin 8/10 > 8/14  IV Maxipime 8/10 > 8/14  Oral levofloxacin 8/14 >  HPI/Subjective: Mild right lower chest/upper abdomen pain with deep inspiration. Cough with intermittent white sputum. No dyspnea. Tolerating diet.  Objective: Filed Vitals:   10/13/12 0800 10/13/12 0900 10/13/12 1000 10/13/12 1112  BP: 143/89     Pulse: 92 100 117   Temp: 98.9 F (37.2 C)     TempSrc: Oral     Resp: 18 16 20    Height:      Weight:      SpO2: 96% 93% 96% 91%    Intake/Output Summary (Last 24 hours) at 10/13/12 1117 Last data filed at 10/13/12 1000  Gross per 24 hour  Intake   2564 ml  Output   1350 ml  Net   1214 ml   Filed Weights   10/10/12 0500 10/12/12 0500 10/13/12 0500  Weight: 111.3 kg (245 lb 6 oz) 113.9 kg (251 lb 1.7 oz) 110.5 kg (243 lb 9.7 oz)    Exam:   General exam: Comfortable.  Respiratory system: Reduced breath sounds in the bases but otherwise clear to auscultation. No increased work of breathing. No pleural rub, crackles, wheezing or rhonchi. Bronchial breath sounds left base.  Cardiovascular system: S1 & S2 heard, RRR. No JVD, murmurs, gallops, clicks or pedal edema. Telemetry: Sinus rhythm.  Gastrointestinal system: Abdomen is nondistended, soft and nontender. Normal bowel sounds heard. Surgical site staples RUQ and periumbilical intact.  Central nervous system: Alert and oriented. No focal neurological deficits.  Extremities: Symmetric 5 x 5 power.   Data Reviewed: Basic  Metabolic Panel:  Recent Labs Lab 10/08/12 0628 10/09/12 0453 10/10/12 0530 10/11/12 0536 10/13/12 0502  NA 136 135 136 137 139  K 4.4 4.0 3.9 3.4* 3.5  CL 100 98 98 98 102  CO2 27 29 30 31 28   GLUCOSE 106* 114* 113* 103* 103*  BUN 4* 4*  4* 6 5*  CREATININE 0.59 0.65 0.55 0.59 0.57  CALCIUM 8.9 8.9 9.0 8.9 9.1   Liver Function Tests:  Recent Labs Lab 10/08/12 0628 10/10/12 0530 10/11/12 0536  AST 33 16 17  ALT 16 10 9   ALKPHOS 65 60 66  BILITOT 0.6 0.6 0.5  PROT 6.2 6.5 6.7  ALBUMIN 2.7* 2.5* 2.5*    Recent Labs Lab 10/08/12 0628  LIPASE 21  AMYLASE 22   No results found for this basename: AMMONIA,  in the last 168 hours CBC:  Recent Labs Lab 10/09/12 0453 10/10/12 0530 10/11/12 0536 10/12/12 0448 10/13/12 0502  WBC 9.2 10.0 7.1 5.2 4.1  HGB 12.4* 11.8* 11.7* 11.2* 11.2*  HCT 39.3 37.3* 37.3* 35.5* 34.9*  MCV 93.8 92.6 93.5 93.2 91.8  PLT 195 180 192 208 143*   Cardiac Enzymes: No results found for this basename: CKTOTAL, CKMB, CKMBINDEX, TROPONINI,  in the last 168 hours BNP (last 3 results)  Recent Labs  08/19/12 1318  PROBNP 43.1   CBG: No results found for this basename: GLUCAP,  in the last 168 hours  No results found for this or any previous visit (from the past 240 hour(s)).   Studies: No results found.   Additional labs:   Scheduled Meds: . acetylcysteine  2 mL Nebulization Q4H  . albuterol  2.5 mg Nebulization Q4H  . antiseptic oral rinse  15 mL Mouth Rinse BID  . furosemide  20 mg Oral Daily  . guaiFENesin  1,200 mg Oral BID  . levofloxacin  750 mg Oral Daily   Continuous Infusions: . 0.9 % NaCl with KCl 20 mEq / L 10 mL/hr (10/12/12 1516)  . heparin 2,800 Units/hr (10/12/12 2229)    Active Problems:   COPD (chronic obstructive pulmonary disease)   Hypokalemia   Acute pulmonary embolism   HCAP (healthcare-associated pneumonia)   Acute respiratory failure with hypoxia   Anemia    Time spent: 25 minutes.    [facility]  [facility] Pager [phone].   If 8PM-8AM, please contact night-coverage at [email or web address], password [facility] 10/13/2012, 11:17 AM  LOS: 6 days

## 2012-10-13 NOTE — Progress Notes (Signed)
Report called to RN. Pt transferred via wheelchair with personal belongings. 

## 2012-10-13 NOTE — Progress Notes (Signed)
Pt ambulated off floor to outside garden with RN and NT. Pt tolerated well. No complaints. Pt returned to room. Will continue to monitor.

## 2012-10-14 ENCOUNTER — Inpatient Hospital Stay (HOSPITAL_COMMUNITY): Payer: MEDICAID

## 2012-10-14 MED ORDER — HYDROCODONE-ACETAMINOPHEN 5-325 MG PO TABS: 1.0000 | ORAL_TABLET | Freq: Four times a day (QID) | ORAL | Status: DC | PRN

## 2012-10-14 MED ORDER — RIVAROXABAN 20 MG PO TABS: 20.0000 mg | ORAL_TABLET | Freq: Every day | ORAL | Status: DC

## 2012-10-14 MED ORDER — LEVOFLOXACIN 750 MG PO TABS: 750.0000 mg | ORAL_TABLET | Freq: Every day | ORAL | Status: DC

## 2012-10-14 MED ORDER — SENNA 8.6 MG PO TABS: 2.0000 | ORAL_TABLET | Freq: Every day | ORAL | Status: DC

## 2012-10-14 MED ORDER — RIVAROXABAN 15 MG PO TABS: 15.0000 mg | ORAL_TABLET | Freq: Two times a day (BID) | ORAL | Status: DC

## 2012-10-14 NOTE — Progress Notes (Signed)
O2 saturation on RA resting at bedside 92%.  O2 sat dropped to 88% with ambulation.  O2 sat rebounded to 93% with rest after ambulation on RA.

## 2012-10-14 NOTE — Progress Notes (Signed)
POD # 7  Filed Vitals:   10/14/12 0500  BP: 119/76  Pulse: 77  Temp: 98.2 F (36.8 C)  Resp: 17    Doing very well surgically. Abdomen is soft no GI complaints. Wound very clean have removed alternate clips. Will D/C remaining clips in AM or in the office Tuesday 19 Aug. @ 2:00 PM.  Discharge instructions given from my point of view.  Respiratory F/U as per med. Service.

## 2012-10-14 NOTE — Progress Notes (Signed)
IV removed by NT, site WNL.  Pt given d/c instructions and new prescriptions.  Discussed home care with patient and discussed home medications, patient verbalizes understanding, teachback completed. F/U appointments in place with with Malvin Johns and medical MD, pt states they will keep appointment. Pt is stable at this time and desires to go home. Pt taken to main entrance, refused wheelchair, escorted by staff member.

## 2012-10-14 NOTE — Discharge Summary (Signed)
Physician Discharge Summary  Stanley Thompson:[id number] DOB: 19-Dec-1965 DOA: 10/07/2012  PCP: Calla Kicks, MD  Admit date: 10/07/2012 Discharge date: 10/14/2012  Time spent: Greater than 30 minutes  Recommendations for Outpatient Follow-up:  1. PCP ([facility]) on 10/24/2012 @ 1:15 PM. To be seen with CBC. 2. Dr. Barbaraann Barthel, General Surgery on 10/18/2012 @ 2 PM. 3. Followup chest x-ray through PCP in 4-6 weeks to ensure resolution of pneumonia findings.   Di PCPscharge Diagnoses:  Active Problems:   COPD (chronic obstructive pulmonary disease)   Hypokalemia   Acute pulmonary embolism   HCAP (healthcare-associated pneumonia)   Acute respiratory failure with hypoxia   Anemia   Discharge Condition: Improved & Stable  Diet recommendation: Heart Healthy diet.  Filed Weights   10/10/12 0500 10/12/12 0500 10/13/12 0500  Weight: 111.3 kg (245 lb 6 oz) 113.9 kg (251 lb 1.7 oz) 110.5 kg (243 lb 9.7 oz)    History of present illness:  47 year old male with history of tobacco abuse, COPD, alcohol abuse & alcoholic pancreatitis had elective open cholecystectomy for cholelithiasis on 10/07/12.TRH initially consulted on 8/8 for medical management of COPD. On 8/9 he had worsening dyspnea and hypoxia. CT angiogram showed acute PE in RLL and pneumonia in LLL-possibly HCAP/aspiration. Patient was transferred to ICU and started on IV heparin, vancomycin and cefepime. He was transferred to the hospitalist service.   Hospital Course:  1. Status post open cholecystectomy on 10/07/12/chronic cholecystitis & cholelithiasis: Patient presented initially for elective laparoscopic cholecystectomy for cholelithiasis. This was subsequently converted to open cholecystectomy. Postoperative course was complicated by acute hypoxic respiratory failure due to pulmonary embolism and pneumonia. Patient has improved. Surgeons have removed some of his staples and the rest will be removed on  8/19 during outpatient visit. He has been cleared for discharge home. He is tolerating diet, had BMs and only in minimal intermittent pain that operated site. 2. Acute hypoxic respiratory failure: Secondary to acute PE, COPD and pneumonia. Patient was transferred to intensive care unit. He was treated appropriately with anticoagulation and antibiotics. His acute respiratory failure has resolved. Patient is saturating greater than 90% on room air both at rest and with activity. 3. Acute PE-RML & RLL (postoperative): Patient was initially placed on IV heparin infusion on 10/09/12 which was transitioned to oral  Xarelto on 8/14 PM. He will require at least 6 months of anticoagulation. He remained hemodynamically stable. Case manager has obtain approval for free Xarelto from [employer]. Patient has been counseled extensively regarding compliance with medications and he verbalizes understanding. 4. Pneumonia- HCAP vs aspiration: He was treated with IV vancomycin and Maxipime. No cultures drawn. Changed to oral Levaquin on 8/14 and will complete total 8 days treatment. Chest x-ray shows improvement. He will need followup chest x-ray in 4-6 weeks to ensure resolution of pneumonia findings.  5. Anemia: stable.  6. Hypokalemia: Corrected  7. History of Alcohol abuse: has quit on 08/16/12.  8. Tobacco Abuse: cessation counseled.  9. COPD: Stable   Consultants:  Surgery  Pulmonology  Procedures:  Open cholecystectomy 10/07/12  Discharge Exam:  Complaints: Feels much better. Mild and decreasing right upper quadrant pain at operative site. No dyspnea or chest pain. Ambulating halls without complaints. Tolerating diet.  Filed Vitals:   10/13/12 2100 10/14/12 0500 10/14/12 0720 10/14/12 1208  BP: 115/69 119/76    Pulse: 85 77    Temp: 98.6 F (37 C) 98.2 F (36.8 C)    TempSrc: Oral Oral  Resp: 20 17    Height:      Weight:      SpO2: 94% 91% 91% 94%     General exam: Comfortable.    Respiratory system: Reduced breath sounds in the bases but otherwise clear to auscultation. No increased work of breathing. No pleural rub, crackles, wheezing or rhonchi. Bronchial breath sounds left base.   Cardiovascular system: S1 & S2 heard, RRR. No JVD, murmurs, gallops, clicks or pedal edema.   Gastrointestinal system: Abdomen is nondistended, soft and nontender. Normal bowel sounds heard. Surgical site staples RUQ and periumbilical intact.   Central nervous system: Alert and oriented. No focal neurological deficits.   Extremities: Symmetric 5 x 5 power.   Discharge Instructions      Discharge Orders   Future Orders Complete By Expires   Call MD for:  difficulty breathing, headache or visual disturbances  As directed    Call MD for:  extreme fatigue  As directed    Call MD for:  persistant dizziness or light-headedness  As directed    Call MD for:  persistant nausea and vomiting  As directed    Call MD for:  redness, tenderness, or signs of infection (pain, swelling, redness, odor or green/yellow discharge around incision site)  As directed    Call MD for:  severe uncontrolled pain  As directed    Call MD for:  temperature >100.4  As directed    Diet - low sodium heart healthy  As directed    Increase activity slowly  As directed        Medication List         albuterol (2.5 MG/3ML) 0.083% nebulizer solution  Commonly known as:  PROVENTIL  Take 3 mLs (2.5 mg total) by nebulization every 6 (six) hours as needed for wheezing. For shortness of breath/difficulty breathing     COMBIVENT 18-103 MCG/ACT inhaler  Generic drug:  albuterol-ipratropium  Inhale 2 puffs into the lungs every 6 (six) hours as needed for wheezing or shortness of breath.     folic acid 1 MG tablet  Commonly known as:  FOLVITE  Take 1 mg by mouth every other day.     furosemide 20 MG tablet  Commonly known as:  LASIX  Take 20 mg by mouth daily.     HYDROcodone-acetaminophen 5-325 MG per tablet   Commonly known as:  NORCO/VICODIN  Take 1-2 tablets by mouth every 6 (six) hours as needed for pain.     levofloxacin 750 MG tablet  Commonly known as:  LEVAQUIN  Take 1 tablet (750 mg total) by mouth daily.     potassium phosphate (monobasic) 500 MG tablet  Commonly known as:  K-PHOS ORIGINAL  Take 250 mg by mouth daily.     Rivaroxaban 15 MG Tabs tablet  Commonly known as:  XARELTO  Take 1 tablet (15 mg total) by mouth 2 (two) times daily. Started: 10/13/12 PM. After this course completed, start 2nd prescription with 20 mg daily dose.     Rivaroxaban 20 MG Tabs tablet  Commonly known as:  XARELTO  Take 1 tablet (20 mg total) by mouth daily with supper. Start this dose on 11/03/12 PM, after completing the two times daily dose.  Start taking on:  11/03/2012     senna 8.6 MG Tabs tablet  Commonly known as:  SENOKOT  Take 2 tablets (17.2 mg total) by mouth daily.       Follow-up Information   Follow up On 10/24/2012. (  at 1:15)    Contact information:   [facility] [phone]      Follow up with Marlane Hatcher, MD On 10/18/2012. (at 2 PM)    Specialty:  General Surgery   Contact information:   [address] [place] [address] [phone]        The results of significant diagnostics from this hospitalization (including imaging, microbiology, ancillary and laboratory) are listed below for reference.    Significant Diagnostic Studies: Dg Chest 1 View  10/09/2012   *RADIOLOGY REPORT*  Clinical Data: Respiratory failure.  Pulmonary embolism  CHEST - 1 VIEW  Comparison: 10/08/2012  Findings: Progressive left lower lobe atelectasis and infiltrate. There is further volume loss and shift of the heart to the left. Small left effusion.  Right lung is clear.  Negative for heart failure.  IMPRESSION: Progressive collapse and effusion in the left lower lobe.   Original Report Authenticated By: Janeece Riggers, M.D.   Dg Chest 1 View  10/08/2012   *RADIOLOGY REPORT*   Clinical Data: Congestion post cholecystectomy.  CHEST - 1 VIEW  Comparison: 08/19/2012  Findings: Slightly decreased lung volumes.  New left infrahilar atelectasis or consolidation.  There is some linear subsegmental atelectasis or scarring medially in both lung bases.  No effusion. No pneumothorax.  Nasogastric tube extends at least as far as the stomach, tip not seen.  Heart size upper limits normal for technique.  IMPRESSION:  Relatively low lung volumes with new left infrahilar atelectasis or consolidation.   Original Report Authenticated By: D. Andria Rhein, MD   Dg Chest 2 View  10/14/2012   *RADIOLOGY REPORT*  Clinical Data: Hypoxia.  CHEST - 2 VIEW  Comparison: 10/10/2012, 08/19/2012 chest x-ray and 10/09/2012 chest CT.  Findings: Decreased left lower lobe consolidation with incomplete clearance.  This may represent an infectious infiltrate with small pleural effusion.  Recommend follow-up until clearance.  No plain film evidence of pulmonary infarct involving the right lung in this patient who is known to have right pulmonary emboli on recent CT.  No pneumothorax.  Heart size within normal limits.  IMPRESSION: Decreased left lower lobe consolidation with incomplete clearance. This may represent an infectious infiltrate with small pleural effusion.   Original Report Authenticated By: Lacy Duverney, M.D.   Ct Angio Chest Pe W/cm &/or Wo Cm  10/09/2012   *RADIOLOGY REPORT*  Clinical Data: Acute hypoxia; chest pain.  CT ANGIOGRAPHY CHEST  Technique:  Multidetector CT imaging of the chest using the standard protocol during bolus administration of intravenous contrast. Multiplanar reconstructed images including MIPs were obtained and reviewed to evaluate the vascular anatomy.  Contrast: OMNIPAQUE IOHEXOL 350 MG/ML SOLN  Comparison: Chest radiograph performed 10/08/2012  Findings: There is pulmonary embolus within segmental branches to the right middle and lower lung lobes.  There is partial  consolidation of the left lower lobe; this appears to reflect debris and fluid filling the bronchus to the left lower lobe, concerning for aspiration pneumonia.  An associated small left pleural effusion is seen.  A trace right pleural effusion is noted.  There is no evidence of pneumothorax.  No masses are identified; no abnormal focal contrast enhancement is seen.  The mediastinum is unremarkable in appearance.  No mediastinal lymphadenopathy is seen.  No pericardial effusion is identified. No axillary lymphadenopathy is seen.  The thyroid gland is unremarkable in appearance.  A small amount of possibly postoperative ascites is noted about the liver.  The visualized portions of the liver and  spleen are grossly unremarkable in appearance.  The patient is status post cholecystectomy, with a drainage catheter noted at the gallbladder fossa.  The visualized portions of the pancreas, adrenal glands and kidneys are grossly unremarkable.  No acute osseous abnormalities are seen.  IMPRESSION:  1.  Pulmonary embolus within segmental branches to the right middle and lower lung lobes. 2.  Partial consolidation of the left lower lung lobe, with debris and fluid filling the bronchus to the left lower lobe, concerning for aspiration pneumonia.  Associated small left pleural effusion seen. 3.  Trace right pleural effusion noted. 4.  Small amount of possibly postoperative ascites noted about the liver, status post cholecystectomy.  Drainage catheter noted at the gallbladder fossa.  Critical Value/emergent results were called by telephone at the time of interpretation on 10/09/2012 at 03:48 a.m. to Dr. Vania Rea, who verbally acknowledged these results.   Original Report Authenticated By: Tonia Ghent, M.D.   Dg Chest Port 1 View  10/10/2012   *RADIOLOGY REPORT*  Clinical Data: Follow up respiratory failure  PORTABLE CHEST - 1 VIEW  Comparison: Prior chest x-ray 10/09/2012  Findings: Unchanged left basilar consolidation  and small effusion. The right lung remains relatively well aerated and clear.  Stable cardiac and mediastinal contours. No acute osseous abnormality.  No pneumothorax.  Slightly less volume loss in the left hemithorax.  IMPRESSION:  Stable consolidation of the left lower lobe with a small associated pleural effusion.  Slightly decreased volume loss in the left chest, otherwise no significant interval change.   Original Report Authenticated By: Malachy Moan, M.D.    Microbiology: No results found for this or any previous visit (from the past 240 hour(s)).   Labs: Basic Metabolic Panel:  Recent Labs Lab 10/08/12 0628 10/09/12 0453 10/10/12 0530 10/11/12 0536 10/13/12 0502  NA 136 135 136 137 139  K 4.4 4.0 3.9 3.4* 3.5  CL 100 98 98 98 102  CO2 27 29 30 31 28   GLUCOSE 106* 114* 113* 103* 103*  BUN 4* 4* 4* 6 5*  CREATININE 0.59 0.65 0.55 0.59 0.57  CALCIUM 8.9 8.9 9.0 8.9 9.1   Liver Function Tests:  Recent Labs Lab 10/08/12 0628 10/10/12 0530 10/11/12 0536  AST 33 16 17  ALT 16 10 9   ALKPHOS 65 60 66  BILITOT 0.6 0.6 0.5  PROT 6.2 6.5 6.7  ALBUMIN 2.7* 2.5* 2.5*    Recent Labs Lab 10/08/12 0628  LIPASE 21  AMYLASE 22   No results found for this basename: AMMONIA,  in the last 168 hours CBC:  Recent Labs Lab 10/09/12 0453 10/10/12 0530 10/11/12 0536 10/12/12 0448 10/13/12 0502  WBC 9.2 10.0 7.1 5.2 4.1  HGB 12.4* 11.8* 11.7* 11.2* 11.2*  HCT 39.3 37.3* 37.3* 35.5* 34.9*  MCV 93.8 92.6 93.5 93.2 91.8  PLT 195 180 192 208 143*   Cardiac Enzymes: No results found for this basename: CKTOTAL, CKMB, CKMBINDEX, TROPONINI,  in the last 168 hours BNP: BNP (last 3 results)  Recent Labs  08/19/12 1318  PROBNP 43.1   CBG: No results found for this basename: GLUCAP,  in the last 168 hours  Additional labs:  ABG: 8/12: PH 7.4, PCO2 47, PO2 61, bicarbonate 29 and oxygen saturation 89%.  Surgical pathology of gallbladder: Diagnosis Gallbladder -  CHRONIC CHOLECYSTITIS AND CHOLELITHIASIS. Microscopic Comment The attached lymph node shows benign reactive changes.   Signed:  HONGALGI,ANAND  [facility] 10/14/2012, 2:55 PM

## 2012-10-14 NOTE — Progress Notes (Signed)
Patient ambulated on RA, O2 sat 94%.  2lpm via [place] then placed on patient and ambulated O2 sat up to 95%.

## 2012-10-17 NOTE — Progress Notes (Signed)
UR chart review completed.  

## 2013-01-20 ENCOUNTER — Emergency Department (HOSPITAL_COMMUNITY): Payer: Self-pay

## 2013-01-20 ENCOUNTER — Emergency Department (HOSPITAL_COMMUNITY)
Admission: EM | Admit: 2013-01-20 | Discharge: 2013-01-20 | Disposition: A | Discharge status: Home / Self Care | Payer: Self-pay | Attending: Emergency Medicine | Admitting: Emergency Medicine

## 2013-01-20 ENCOUNTER — Encounter (HOSPITAL_COMMUNITY): Payer: Self-pay | Admitting: Emergency Medicine

## 2013-01-20 DIAGNOSIS — R509 Fever, unspecified: Secondary | ICD-10-CM | POA: Insufficient documentation

## 2013-01-20 DIAGNOSIS — Z87891 Personal history of nicotine dependence: Secondary | ICD-10-CM | POA: Insufficient documentation

## 2013-01-20 DIAGNOSIS — S7010XA Contusion of unspecified thigh, initial encounter: Secondary | ICD-10-CM | POA: Insufficient documentation

## 2013-01-20 DIAGNOSIS — Y939 Activity, unspecified: Secondary | ICD-10-CM | POA: Insufficient documentation

## 2013-01-20 DIAGNOSIS — R000 Tachycardia, unspecified: Secondary | ICD-10-CM | POA: Insufficient documentation

## 2013-01-20 DIAGNOSIS — IMO0002 Reserved for concepts with insufficient information to code with codable children: Secondary | ICD-10-CM | POA: Insufficient documentation

## 2013-01-20 DIAGNOSIS — Z79899 Other long term (current) drug therapy: Secondary | ICD-10-CM | POA: Insufficient documentation

## 2013-01-20 DIAGNOSIS — Z8701 Personal history of pneumonia (recurrent): Secondary | ICD-10-CM | POA: Insufficient documentation

## 2013-01-20 DIAGNOSIS — Z7901 Long term (current) use of anticoagulants: Secondary | ICD-10-CM | POA: Insufficient documentation

## 2013-01-20 DIAGNOSIS — R296 Repeated falls: Secondary | ICD-10-CM | POA: Insufficient documentation

## 2013-01-20 DIAGNOSIS — J441 Chronic obstructive pulmonary disease with (acute) exacerbation: Secondary | ICD-10-CM | POA: Insufficient documentation

## 2013-01-20 DIAGNOSIS — Z86711 Personal history of pulmonary embolism: Secondary | ICD-10-CM | POA: Insufficient documentation

## 2013-01-20 DIAGNOSIS — Y929 Unspecified place or not applicable: Secondary | ICD-10-CM | POA: Insufficient documentation

## 2013-01-20 DIAGNOSIS — K921 Melena: Secondary | ICD-10-CM | POA: Insufficient documentation

## 2013-01-20 DIAGNOSIS — R112 Nausea with vomiting, unspecified: Secondary | ICD-10-CM | POA: Insufficient documentation

## 2013-01-20 LAB — COMPREHENSIVE METABOLIC PANEL
ALT: 43 U/L (ref 0–53)
AST: 94 U/L — ABNORMAL HIGH (ref 0–37)
Alkaline Phosphatase: 107 U/L (ref 39–117)
Calcium: 9.7 mg/dL (ref 8.4–10.5)
Glucose, Bld: 111 mg/dL — ABNORMAL HIGH (ref 70–99)
Potassium: 3.7 mEq/L (ref 3.5–5.1)
Sodium: 140 mEq/L (ref 135–145)
Total Protein: 7.3 g/dL (ref 6.0–8.3)

## 2013-01-20 LAB — CBC WITH DIFFERENTIAL/PLATELET
Basophils Relative: 0 % (ref 0–1)
Eosinophils Absolute: 0 10*3/uL (ref 0.0–0.7)
Eosinophils Relative: 1 % (ref 0–5)
HCT: 39.6 % (ref 39.0–52.0)
Hemoglobin: 13 g/dL (ref 13.0–17.0)
Lymphs Abs: 1.3 10*3/uL (ref 0.7–4.0)
MCH: 30 pg (ref 26.0–34.0)
MCHC: 32.8 g/dL (ref 30.0–36.0)
MCV: 91.5 fL (ref 78.0–100.0)
Monocytes Absolute: 0.5 10*3/uL (ref 0.1–1.0)
Monocytes Relative: 6 % (ref 3–12)
Neutrophils Relative %: 79 % — ABNORMAL HIGH (ref 43–77)
RBC: 4.33 MIL/uL (ref 4.22–5.81)

## 2013-01-20 LAB — URINALYSIS, ROUTINE W REFLEX MICROSCOPIC
Glucose, UA: NEGATIVE mg/dL
Hgb urine dipstick: NEGATIVE
Leukocytes, UA: NEGATIVE
Protein, ur: NEGATIVE mg/dL
Specific Gravity, Urine: 1.01 (ref 1.005–1.030)
Urobilinogen, UA: 1 mg/dL (ref 0.0–1.0)

## 2013-01-20 MED ORDER — SODIUM CHLORIDE 0.9 % IV BOLUS (SEPSIS)
1000.0000 mL | Freq: Once | INTRAVENOUS | Status: AC
  Administered 2013-01-20: 1000 mL via INTRAVENOUS

## 2013-01-20 MED ORDER — ONDANSETRON 4 MG PO TBDP: 4.0000 mg | ORAL_TABLET | Freq: Three times a day (TID) | ORAL | Status: DC | PRN

## 2013-01-20 MED ORDER — PROMETHAZINE HCL 25 MG/ML IJ SOLN
12.5000 mg | Freq: Once | INTRAMUSCULAR | Status: AC
  Administered 2013-01-20: 12.5 mg via INTRAVENOUS
  Filled 2013-01-20: qty 1

## 2013-01-20 MED ORDER — PROMETHAZINE HCL 25 MG/ML IJ SOLN
12.5000 mg | Freq: Once | INTRAMUSCULAR | Status: DC
  Filled 2013-01-20: qty 1

## 2013-01-20 MED ORDER — PROMETHAZINE HCL 25 MG/ML IJ SOLN
INTRAMUSCULAR | Status: AC
  Filled 2013-01-20: qty 1

## 2013-01-20 MED ORDER — IOHEXOL 300 MG/ML  SOLN
50.0000 mL | Freq: Once | INTRAMUSCULAR | Status: AC | PRN
  Administered 2013-01-20: 50 mL via ORAL

## 2013-01-20 MED ORDER — ALBUTEROL SULFATE (5 MG/ML) 0.5% IN NEBU
2.5000 mg | INHALATION_SOLUTION | Freq: Once | RESPIRATORY_TRACT | Status: AC
  Administered 2013-01-20: 2.5 mg via RESPIRATORY_TRACT
  Filled 2013-01-20: qty 0.5

## 2013-01-20 MED ORDER — IOHEXOL 300 MG/ML  SOLN
100.0000 mL | Freq: Once | INTRAMUSCULAR | Status: AC | PRN
  Administered 2013-01-20: 100 mL via INTRAVENOUS

## 2013-01-20 MED ORDER — ONDANSETRON HCL 4 MG/2ML IJ SOLN
4.0000 mg | Freq: Once | INTRAMUSCULAR | Status: AC
  Administered 2013-01-20: 4 mg via INTRAVENOUS
  Filled 2013-01-20: qty 2

## 2013-01-20 MED ORDER — SODIUM CHLORIDE 0.9 % IV SOLN
INTRAVENOUS | Status: DC
  Administered 2013-01-20: 16:00:00 via INTRAVENOUS

## 2013-01-20 MED ORDER — HYDROMORPHONE HCL PF 1 MG/ML IJ SOLN
1.0000 mg | Freq: Once | INTRAMUSCULAR | Status: AC
  Administered 2013-01-20: 1 mg via INTRAVENOUS
  Filled 2013-01-20: qty 1

## 2013-01-20 MED ORDER — PROMETHAZINE HCL 25 MG/ML IJ SOLN
25.0000 mg | Freq: Once | INTRAMUSCULAR | Status: DC
  Filled 2013-01-20: qty 1

## 2013-01-20 MED ORDER — PROMETHAZINE HCL 25 MG PO TABS: 25.0000 mg | ORAL_TABLET | Freq: Four times a day (QID) | ORAL | Status: DC | PRN

## 2013-01-20 MED ORDER — IPRATROPIUM BROMIDE 0.02 % IN SOLN
0.5000 mg | Freq: Once | RESPIRATORY_TRACT | Status: AC
  Administered 2013-01-20: 0.5 mg via RESPIRATORY_TRACT
  Filled 2013-01-20: qty 2.5

## 2013-01-20 NOTE — ED Notes (Signed)
Pt reports nausea.  Notified edp

## 2013-01-20 NOTE — ED Notes (Signed)
Vomiting, shortness of breath began 2 nights ago.  Has vomited bile approximately 40 x.  Not associated w/intake - cannot keep anything down.  Denies diarrhea, but has had black, tarry stools.

## 2013-01-20 NOTE — ED Provider Notes (Signed)
CSN: [id number]     Arrival date & time 01/20/13  1315 History   First MD Initiated Contact with Patient 01/20/13 1421     This chart was scribed for Shelda Jakes, MD by Manuela Schwartz, ED scribe. This patient was seen in room APA14/APA14 and the patient's care was started at 1421.  Chief Complaint  Patient presents with  . Shortness of Breath  . Emesis   Patient is a 47 y.o. male presenting with shortness of breath and vomiting. The history is provided by the patient. No language interpreter was used.  Shortness of Breath Severity:  Mild Onset quality:  Gradual Duration:  2 days Timing:  Constant Progression:  Unchanged Chronicity:  New Context: not activity and not URI   Relieved by:  Nothing Worsened by:  Nothing tried Ineffective treatments:  None tried Associated symptoms: abdominal pain, chest pain, fever and vomiting   Associated symptoms: no cough, no rash, no sore throat and no syncope   Emesis Associated symptoms: abdominal pain   Associated symptoms: no chills, no diarrhea and no sore throat    HPI Comments: KHIREE BUKHARI is a 47 y.o. male who presents to the Emergency Department complaining of SOB, emesis episodes, black/tarry stools, all onset of 2 days ago. He states inability to keep down liquids or food due to emesis episodes and is feeling dehydrated. He reports out of combivent inhaler, still using albuterol though. He reports associated specs of blood in vomitus, diffuse abdominal pain, fever/chills, CP, visual changes (spots in vision), light headiness/dizziness. He states had right sided PE in June. He has been on iron supplements x2 weeks. He denies diarrhea, dysuria, hematuria, lower leg swelling, skin rash, HA. He reports fell in the shower last PM, bruise to right lateral thigh, did not hit his head, no LOC.   He is followed by the [facility] [facility].  Past Medical History  Diagnosis Date  . Asthma   . Pneumonia   . Bronchitis   . COPD  (chronic obstructive pulmonary disease)   . Shortness of breath   . Alcoholic pancreatitis    Past Surgical History  Procedure Laterality Date  . Back surgery  1984    [facility]  . Orif mandibular fracture  2004    [facility]  . Cholecystectomy N/A 10/07/2012    Procedure: ATTEMPTED LAPAROSCOPIC CHOLECYSTECTOMY;  Surgeon: Marlane Hatcher, MD;  Location: [facility];  Service: General;  Laterality: N/A;  decision to open at 0843; converted to open start at 0852  . Cholecystectomy N/A 10/07/2012    Procedure: CHOLECYSTECTOMY;  Surgeon: Marlane Hatcher, MD;  Location: [facility];  Service: General;  Laterality: N/A;  converted to open; start at 0852   Family History  Problem Relation Age of Onset  . Hypertension Mother   . Cancer Mother   . Hypertension Father   . Heart failure Father   . Cancer Father    History  Substance Use Topics  . Smoking status: Former Smoker -- 32 years    Types: Cigarettes    Quit date: 07/19/2012  . Smokeless tobacco: Former Neurosurgeon     Comment: 4 packs of cigarettes per week   . Alcohol Use: No     Comment: former drinker, last drink was 08/17/12    Review of Systems  Constitutional: Positive for fever. Negative for chills.  HENT: Negative for congestion, rhinorrhea and sore throat.   Respiratory: Positive for shortness of breath. Negative for  cough.   Cardiovascular: Positive for chest pain. Negative for syncope.  Gastrointestinal: Positive for vomiting, abdominal pain and blood in stool. Negative for nausea and diarrhea.  Musculoskeletal: Negative for back pain.  Skin: Negative for color change and rash.  Neurological: Negative for syncope.  All other systems reviewed and are negative.   A complete 10 system review of systems was obtained and all systems are negative except as noted in the HPI and PMH.   Allergies  Advair diskus  Home Medications   Current Outpatient Rx  Name  Route  Sig  Dispense  Refill  . albuterol  (PROVENTIL) (2.5 MG/3ML) 0.083% nebulizer solution   Nebulization   Take 3 mLs (2.5 mg total) by nebulization every 6 (six) hours as needed for wheezing. For shortness of breath/difficulty breathing   75 mL   0   . albuterol-ipratropium (COMBIVENT) 18-103 MCG/ACT inhaler   Inhalation   Inhale 2 puffs into the lungs every 6 (six) hours as needed for wheezing or shortness of breath.         . ferrous sulfate 325 (65 FE) MG tablet   Oral   Take 325 mg by mouth daily.         . fluticasone (FLOVENT HFA) 44 MCG/ACT inhaler   Inhalation   Inhale 2 puffs into the lungs 3 (three) times daily.         . folic acid (FOLVITE) 1 MG tablet   Oral   Take 1 mg by mouth every other day.         . furosemide (LASIX) 20 MG tablet   Oral   Take 20 mg by mouth daily.         . rivaroxaban (XARELTO) 20 MG TABS tablet   Oral   Take 1 tablet (20 mg total) by mouth daily with supper. Start this dose on 11/03/12 PM, after completing the two times daily dose.   30 tablet   0   . senna (SENOKOT) 8.6 MG TABS tablet   Oral   Take 2 tablets (17.2 mg total) by mouth daily.         Marland Kitchen tetrahydrozoline (VISINE) 0.05 % ophthalmic solution   Both Eyes   Place 1 drop into both eyes daily as needed (dry eyes).         . ondansetron (ZOFRAN ODT) 4 MG disintegrating tablet   Oral   Take 1 tablet (4 mg total) by mouth every 8 (eight) hours as needed.   10 tablet   1   . potassium phosphate, monobasic, (K-PHOS ORIGINAL) 500 MG tablet   Oral   Take 500 mg by mouth every other day.          . promethazine (PHENERGAN) 25 MG tablet   Oral   Take 1 tablet (25 mg total) by mouth every 6 (six) hours as needed for nausea or vomiting.   12 tablet   0    Triage Vitals: BP 133/88  Pulse 101  Temp(Src) 99 F (37.2 C) (Oral)  Resp 16  Ht 6\' 4"  (1.93 m)  Wt 250 lb (113.399 kg)  BMI 30.44 kg/m2  SpO2 97% Physical Exam  Nursing note and vitals reviewed. Constitutional: He is oriented to  person, place, and time. He appears well-developed and well-nourished. No distress.  HENT:  Head: Normocephalic and atraumatic.  Dry mucous membranes  Eyes: Conjunctivae and EOM are normal. Right eye exhibits no discharge. Left eye exhibits no discharge.  Neck: Normal range of  motion. No tracheal deviation present.  Cardiovascular: Regular rhythm and normal heart sounds.   Slightly tachycardic.  Pulmonary/Chest: Effort normal and breath sounds normal. No respiratory distress. He has no wheezes. He has no rales.  Abdominal: Soft. Bowel sounds are normal. He exhibits no distension. There is no tenderness.  Genitourinary: Guaiac negative stool.  Rectal dark stool but heme-negative. Maybe related to the iron.  Musculoskeletal: Normal range of motion. He exhibits no edema.  Neurological: He is alert and oriented to person, place, and time. No cranial nerve deficit.  Skin: Skin is warm and dry.  Psychiatric: He has a normal mood and affect. Thought content normal.    ED Course  Procedures (including critical care time) DIAGNOSTIC STUDIES: Oxygen Saturation is 97% on room air, normal by my interpretation.    COORDINATION OF CARE: At 230 PM Discussed treatment plan with patient which includes IV fluids, nausea/pain medicine, breathing tx, blood work, abdominal CT, CXR, UA. Patient agrees.   Labs Review Labs Reviewed  URINALYSIS, ROUTINE W REFLEX MICROSCOPIC - Abnormal; Notable for the following:    Color, Urine ORANGE (*)    pH >9.0 (*)    Bilirubin Urine SMALL (*)    Ketones, ur TRACE (*)    All other components within normal limits  COMPREHENSIVE METABOLIC PANEL - Abnormal; Notable for the following:    Glucose, Bld 111 (*)    AST 94 (*)    Total Bilirubin 2.0 (*)    All other components within normal limits  CBC WITH DIFFERENTIAL - Abnormal; Notable for the following:    RDW 15.6 (*)    Platelets 110 (*)    Neutrophils Relative % 79 (*)    All other components within normal  limits  PRO B NATRIURETIC PEPTIDE - Abnormal; Notable for the following:    Pro B Natriuretic peptide (BNP) 194.4 (*)    All other components within normal limits  LIPASE, BLOOD  URINE MICROSCOPIC-ADD ON   Results for orders placed during the hospital encounter of 01/20/13  URINALYSIS, ROUTINE W REFLEX MICROSCOPIC      Result Value Range   Color, Urine ORANGE (*) YELLOW   APPearance CLEAR  CLEAR   Specific Gravity, Urine 1.010  1.005 - 1.030   pH >9.0 (*) 5.0 - 8.0   Glucose, UA NEGATIVE  NEGATIVE mg/dL   Hgb urine dipstick NEGATIVE  NEGATIVE   Bilirubin Urine SMALL (*) NEGATIVE   Ketones, ur TRACE (*) NEGATIVE mg/dL   Protein, ur NEGATIVE  NEGATIVE mg/dL   Urobilinogen, UA 1.0  0.0 - 1.0 mg/dL   Nitrite NEGATIVE  NEGATIVE   Leukocytes, UA NEGATIVE  NEGATIVE  COMPREHENSIVE METABOLIC PANEL      Result Value Range   Sodium 140  135 - 145 mEq/L   Potassium 3.7  3.5 - 5.1 mEq/L   Chloride 97  96 - 112 mEq/L   CO2 32  19 - 32 mEq/L   Glucose, Bld 111 (*) 70 - 99 mg/dL   BUN 9  6 - 23 mg/dL   Creatinine, Ser 5.62  0.50 - 1.35 mg/dL   Calcium 9.7  8.4 - 13.0 mg/dL   Total Protein 7.3  6.0 - 8.3 g/dL   Albumin 3.7  3.5 - 5.2 g/dL   AST 94 (*) 0 - 37 U/L   ALT 43  0 - 53 U/L   Alkaline Phosphatase 107  39 - 117 U/L   Total Bilirubin 2.0 (*) 0.3 - 1.2 mg/dL  GFR calc non Af Amer >90  >90 mL/min   GFR calc Af Amer >90  >90 mL/min  LIPASE, BLOOD      Result Value Range   Lipase 16  11 - 59 U/L  CBC WITH DIFFERENTIAL      Result Value Range   WBC 8.8  4.0 - 10.5 K/uL   RBC 4.33  4.22 - 5.81 MIL/uL   Hemoglobin 13.0  13.0 - 17.0 g/dL   HCT 54.0  98.1 - 19.1 %   MCV 91.5  78.0 - 100.0 fL   MCH 30.0  26.0 - 34.0 pg   MCHC 32.8  30.0 - 36.0 g/dL   RDW 47.8 (*) 29.5 - 62.1 %   Platelets 110 (*) 150 - 400 K/uL   Neutrophils Relative % 79 (*) 43 - 77 %   Neutro Abs 7.0  1.7 - 7.7 K/uL   Lymphocytes Relative 14  12 - 46 %   Lymphs Abs 1.3  0.7 - 4.0 K/uL   Monocytes Relative 6   3 - 12 %   Monocytes Absolute 0.5  0.1 - 1.0 K/uL   Eosinophils Relative 1  0 - 5 %   Eosinophils Absolute 0.0  0.0 - 0.7 K/uL   Basophils Relative 0  0 - 1 %   Basophils Absolute 0.0  0.0 - 0.1 K/uL  PRO B NATRIURETIC PEPTIDE      Result Value Range   Pro B Natriuretic peptide (BNP) 194.4 (*) 0 - 125 pg/mL    Imaging Review Dg Chest 2 View  01/20/2013   CLINICAL DATA:  Cough, fever, wheezing and vomiting.  EXAM: CHEST  2 VIEW  COMPARISON:  Chest x-ray 10/24/2012.  FINDINGS: Lung volumes are normal. No consolidative airspace disease. No pleural effusions. No pneumothorax. No pulmonary nodule or mass noted. Pulmonary vasculature and the cardiomediastinal silhouette are within normal limits. Old vertebral body compression fracture of a lower thoracic vertebral body (likely T9) with approximately 30% loss of anterior vertebral body height, unchanged.  IMPRESSION: 1. No radiographic evidence of acute cardiopulmonary disease. 2. Unchanged thoracic vertebral body compression fracture, as above.   Electronically Signed   By: Trudie Reed M.D.   On: 01/20/2013 17:07   Ct Abdomen Pelvis W Contrast  01/20/2013   CLINICAL DATA:  47 year old male with shortness of breath, emesis, abdominal pain dark, tarry stools.  EXAM: CT ABDOMEN AND PELVIS WITH CONTRAST  TECHNIQUE: Multidetector CT imaging of the abdomen and pelvis was performed using the standard protocol following bolus administration of intravenous contrast.  CONTRAST:  50mL OMNIPAQUE IOHEXOL 300 MG/ML SOLN, OMNIPAQUE IOHEXOL 300 MG/ML SOLN  COMPARISON:  Prior CT abdomen/ pelvis 08/19/2012  FINDINGS: Lower Chest: The lung bases are clear. Visualized cardiac structures are within normal limits for size. No pericardial effusion. Unremarkable visualized distal thoracic esophagus.  Abdomen: Unenhanced CT was performed per clinician order. Lack of IV contrast limits sensitivity and specificity, especially for evaluation of abdominal/pelvic solid  viscera. Unremarkable CT appearance of the stomach, duodenum, spleen, adrenal glands and pancreas save for mild fatty atrophy in the pancreatic head and uncinate process. Diffuse hypoattenuation of the hepatic parenchyma consistent with moderately advanced steatosis. Patient is status post cholecystectomy. No intra or extrahepatic biliary ductal dilatation.  Unremarkable appearance of the bilateral kidneys. No focal solid lesion, hydronephrosis or nephrolithiasis.  No evidence of obstruction or focal bowel wall thickening. Normal appendix in the right lower quadrant. The terminal ileum is unremarkable. No free air, free fluid  or suspicious adenopathy.  Pelvis: Unremarkable bladder, prostate gland and seminal vesicles. No free fluid or suspicious adenopathy.  Bones/Soft Tissues: No acute fracture or aggressive appearing lytic or blastic osseous lesion. L5-S1 degenerative disc disease.  Vascular: Trace atherosclerotic vascular calcifications in the distal aorta and bilateral common iliac arteries without aneurysmal dilatation, at evidence for dissection or significant stenosis.  IMPRESSION: 1. No acute abnormality in the abdomen or pelvis to explain the patient's clinical symptoms. 2. At least moderate hepatic steatosis. 3. Surgical changes of prior cholecystectomy. 4. Trace atherosclerotic vascular calcifications. 5. L5-S1 degenerative disc disease.   Electronically Signed   By: Malachy Moan M.D.   On: 01/20/2013 17:21    EKG Interpretation   None       MDM   1. Nausea & vomiting    The patient to workup for the nausea and vomiting no significant lab abnormalities. Patient also is concerned about black stools no change in hemoglobin and hematocrit no evidence of heme positive stools here. Patient CT of the abdomen without any acute findings chest x-rays negative. Labs without significant abnormalities and no evidence of significant dehydration. Patient improved here with anti-emetics fluids and some  pain medicine feels much better.   I personally performed the services described in this documentation, which was scribed in my presence. The recorded information has been reviewed and is accurate.       Shelda Jakes, MD 01/20/13 [phone]

## 2013-01-20 NOTE — ED Notes (Signed)
Reminded pt about urine specimen.  Offered pt a urinal

## 2013-06-24 ENCOUNTER — Inpatient Hospital Stay (HOSPITAL_COMMUNITY)
Admission: EM | Admit: 2013-06-24 | Discharge: 2013-06-27 | DRG: 392 | Disposition: A | Discharge status: Home / Self Care | Payer: Self-pay | Attending: Internal Medicine | Admitting: Internal Medicine

## 2013-06-24 ENCOUNTER — Encounter (HOSPITAL_COMMUNITY): Payer: Self-pay | Admitting: Emergency Medicine

## 2013-06-24 DIAGNOSIS — D6959 Other secondary thrombocytopenia: Secondary | ICD-10-CM | POA: Diagnosis present

## 2013-06-24 DIAGNOSIS — K859 Acute pancreatitis without necrosis or infection, unspecified: Secondary | ICD-10-CM

## 2013-06-24 DIAGNOSIS — R197 Diarrhea, unspecified: Secondary | ICD-10-CM

## 2013-06-24 DIAGNOSIS — A088 Other specified intestinal infections: Principal | ICD-10-CM | POA: Diagnosis present

## 2013-06-24 DIAGNOSIS — J9601 Acute respiratory failure with hypoxia: Secondary | ICD-10-CM

## 2013-06-24 DIAGNOSIS — E860 Dehydration: Secondary | ICD-10-CM | POA: Diagnosis present

## 2013-06-24 DIAGNOSIS — D539 Nutritional anemia, unspecified: Secondary | ICD-10-CM | POA: Diagnosis present

## 2013-06-24 DIAGNOSIS — E876 Hypokalemia: Secondary | ICD-10-CM | POA: Diagnosis present

## 2013-06-24 DIAGNOSIS — Z8249 Family history of ischemic heart disease and other diseases of the circulatory system: Secondary | ICD-10-CM

## 2013-06-24 DIAGNOSIS — E8809 Other disorders of plasma-protein metabolism, not elsewhere classified: Secondary | ICD-10-CM

## 2013-06-24 DIAGNOSIS — J449 Chronic obstructive pulmonary disease, unspecified: Secondary | ICD-10-CM | POA: Diagnosis present

## 2013-06-24 DIAGNOSIS — K5289 Other specified noninfective gastroenteritis and colitis: Secondary | ICD-10-CM | POA: Diagnosis present

## 2013-06-24 DIAGNOSIS — F17200 Nicotine dependence, unspecified, uncomplicated: Secondary | ICD-10-CM | POA: Diagnosis present

## 2013-06-24 DIAGNOSIS — D7589 Other specified diseases of blood and blood-forming organs: Secondary | ICD-10-CM | POA: Diagnosis present

## 2013-06-24 DIAGNOSIS — R112 Nausea with vomiting, unspecified: Secondary | ICD-10-CM | POA: Diagnosis present

## 2013-06-24 DIAGNOSIS — K7689 Other specified diseases of liver: Secondary | ICD-10-CM | POA: Diagnosis present

## 2013-06-24 DIAGNOSIS — N390 Urinary tract infection, site not specified: Secondary | ICD-10-CM | POA: Diagnosis present

## 2013-06-24 DIAGNOSIS — J4489 Other specified chronic obstructive pulmonary disease: Secondary | ICD-10-CM | POA: Diagnosis present

## 2013-06-24 DIAGNOSIS — E669 Obesity, unspecified: Secondary | ICD-10-CM

## 2013-06-24 DIAGNOSIS — D649 Anemia, unspecified: Secondary | ICD-10-CM

## 2013-06-24 DIAGNOSIS — K8020 Calculus of gallbladder without cholecystitis without obstruction: Secondary | ICD-10-CM

## 2013-06-24 DIAGNOSIS — I10 Essential (primary) hypertension: Secondary | ICD-10-CM | POA: Diagnosis present

## 2013-06-24 DIAGNOSIS — I2699 Other pulmonary embolism without acute cor pulmonale: Secondary | ICD-10-CM | POA: Diagnosis present

## 2013-06-24 DIAGNOSIS — F1010 Alcohol abuse, uncomplicated: Secondary | ICD-10-CM | POA: Diagnosis present

## 2013-06-24 DIAGNOSIS — Z86711 Personal history of pulmonary embolism: Secondary | ICD-10-CM

## 2013-06-24 DIAGNOSIS — R748 Abnormal levels of other serum enzymes: Secondary | ICD-10-CM | POA: Diagnosis present

## 2013-06-24 DIAGNOSIS — J189 Pneumonia, unspecified organism: Secondary | ICD-10-CM

## 2013-06-24 DIAGNOSIS — K529 Noninfective gastroenteritis and colitis, unspecified: Secondary | ICD-10-CM

## 2013-06-24 LAB — CBC WITH DIFFERENTIAL/PLATELET
BASOS ABS: 0 10*3/uL (ref 0.0–0.1)
Basophils Relative: 0 % (ref 0–1)
Eosinophils Absolute: 0.1 10*3/uL (ref 0.0–0.7)
Eosinophils Relative: 1 % (ref 0–5)
HEMATOCRIT: 37.7 % — AB (ref 39.0–52.0)
Hemoglobin: 13.1 g/dL (ref 13.0–17.0)
LYMPHS ABS: 1 10*3/uL (ref 0.7–4.0)
Lymphocytes Relative: 16 % (ref 12–46)
MCH: 37.2 pg — ABNORMAL HIGH (ref 26.0–34.0)
MCHC: 34.7 g/dL (ref 30.0–36.0)
MCV: 107.1 fL — AB (ref 78.0–100.0)
MONO ABS: 0.7 10*3/uL (ref 0.1–1.0)
Monocytes Relative: 10 % (ref 3–12)
Neutro Abs: 4.7 10*3/uL (ref 1.7–7.7)
Neutrophils Relative %: 73 % (ref 43–77)
Platelets: 84 10*3/uL — ABNORMAL LOW (ref 150–400)
RBC: 3.52 MIL/uL — ABNORMAL LOW (ref 4.22–5.81)
RDW: 15.7 % — AB (ref 11.5–15.5)
WBC: 6.4 10*3/uL (ref 4.0–10.5)

## 2013-06-24 LAB — URINE MICROSCOPIC-ADD ON

## 2013-06-24 LAB — URINALYSIS, ROUTINE W REFLEX MICROSCOPIC
GLUCOSE, UA: 100 mg/dL — AB
Hgb urine dipstick: NEGATIVE
Ketones, ur: 15 mg/dL — AB
Nitrite: POSITIVE — AB
PROTEIN: 30 mg/dL — AB
Specific Gravity, Urine: 1.01 (ref 1.005–1.030)
Urobilinogen, UA: >8 mg/dL — ABNORMAL HIGH (ref 0.0–1.0)
pH: 7.5 (ref 5.0–8.0)

## 2013-06-24 LAB — COMPREHENSIVE METABOLIC PANEL
ALT: 49 U/L (ref 0–53)
AST: 203 U/L — ABNORMAL HIGH (ref 0–37)
Albumin: 3.4 g/dL — ABNORMAL LOW (ref 3.5–5.2)
Alkaline Phosphatase: 208 U/L — ABNORMAL HIGH (ref 39–117)
BUN: 7 mg/dL (ref 6–23)
CALCIUM: 8.9 mg/dL (ref 8.4–10.5)
CO2: 33 meq/L — AB (ref 19–32)
Chloride: 87 mEq/L — ABNORMAL LOW (ref 96–112)
Creatinine, Ser: 0.63 mg/dL (ref 0.50–1.35)
GLUCOSE: 103 mg/dL — AB (ref 70–99)
Potassium: 3.1 mEq/L — ABNORMAL LOW (ref 3.7–5.3)
Sodium: 135 mEq/L — ABNORMAL LOW (ref 137–147)
Total Bilirubin: 3.2 mg/dL — ABNORMAL HIGH (ref 0.3–1.2)
Total Protein: 7.3 g/dL (ref 6.0–8.3)

## 2013-06-24 LAB — LIPASE, BLOOD: LIPASE: 59 U/L (ref 11–59)

## 2013-06-24 MED ORDER — NAPHAZOLINE HCL 0.1 % OP SOLN
1.0000 [drp] | Freq: Four times a day (QID) | OPHTHALMIC | Status: DC | PRN
  Filled 2013-06-24: qty 15

## 2013-06-24 MED ORDER — IPRATROPIUM-ALBUTEROL 0.5-2.5 (3) MG/3ML IN SOLN
3.0000 mL | Freq: Four times a day (QID) | RESPIRATORY_TRACT | Status: DC | PRN
  Administered 2013-06-24 – 2013-06-25 (×2): 3 mL via RESPIRATORY_TRACT
  Filled 2013-06-24 (×2): qty 3

## 2013-06-24 MED ORDER — RIVAROXABAN 20 MG PO TABS
20.0000 mg | ORAL_TABLET | Freq: Every day | ORAL | Status: DC
  Administered 2013-06-24 – 2013-06-26 (×3): 20 mg via ORAL
  Filled 2013-06-24 (×3): qty 1

## 2013-06-24 MED ORDER — ALBUTEROL SULFATE (2.5 MG/3ML) 0.083% IN NEBU: 2.5000 mg | INHALATION_SOLUTION | Freq: Four times a day (QID) | RESPIRATORY_TRACT | Status: DC | PRN

## 2013-06-24 MED ORDER — FLUTICASONE PROPIONATE HFA 44 MCG/ACT IN AERO
2.0000 | INHALATION_SPRAY | Freq: Two times a day (BID) | RESPIRATORY_TRACT | Status: DC
  Administered 2013-06-24 – 2013-06-27 (×6): 2 via RESPIRATORY_TRACT
  Filled 2013-06-24: qty 10.6

## 2013-06-24 MED ORDER — SODIUM CHLORIDE 0.9 % IV SOLN: INTRAVENOUS | Status: DC

## 2013-06-24 MED ORDER — CEFTRIAXONE SODIUM 1 G IJ SOLR
1.0000 g | INTRAMUSCULAR | Status: DC
  Administered 2013-06-25 – 2013-06-26 (×2): 1 g via INTRAVENOUS
  Filled 2013-06-24 (×3): qty 10

## 2013-06-24 MED ORDER — SODIUM CHLORIDE 0.9 % IV BOLUS (SEPSIS)
1000.0000 mL | Freq: Once | INTRAVENOUS | Status: AC
  Administered 2013-06-24: 1000 mL via INTRAVENOUS

## 2013-06-24 MED ORDER — ONDANSETRON HCL 4 MG/2ML IJ SOLN: 4.0000 mg | Freq: Four times a day (QID) | INTRAMUSCULAR | Status: DC | PRN

## 2013-06-24 MED ORDER — CALCIUM POLYCARBOPHIL 625 MG PO TABS
ORAL_TABLET | ORAL | Status: AC
  Filled 2013-06-24: qty 1

## 2013-06-24 MED ORDER — FOLIC ACID 1 MG PO TABS
1.0000 mg | ORAL_TABLET | ORAL | Status: DC
Start: 2013-06-26 — End: 2013-06-27
  Administered 2013-06-26: 1 mg via ORAL
  Filled 2013-06-24 (×2): qty 1

## 2013-06-24 MED ORDER — FLUTICASONE PROPIONATE HFA 44 MCG/ACT IN AERO
INHALATION_SPRAY | RESPIRATORY_TRACT | Status: AC
  Filled 2013-06-24: qty 10.6

## 2013-06-24 MED ORDER — ONDANSETRON HCL 4 MG/2ML IJ SOLN
4.0000 mg | Freq: Once | INTRAMUSCULAR | Status: AC
  Administered 2013-06-24: 4 mg via INTRAVENOUS
  Filled 2013-06-24: qty 2

## 2013-06-24 MED ORDER — POTASSIUM CHLORIDE IN NACL 40-0.9 MEQ/L-% IV SOLN
INTRAVENOUS | Status: DC
  Administered 2013-06-24 – 2013-06-27 (×5): via INTRAVENOUS

## 2013-06-24 MED ORDER — CALCIUM POLYCARBOPHIL 625 MG PO TABS
625.0000 mg | ORAL_TABLET | Freq: Three times a day (TID) | ORAL | Status: DC
  Administered 2013-06-24 – 2013-06-27 (×7): 625 mg via ORAL
  Filled 2013-06-24 (×11): qty 1

## 2013-06-24 MED ORDER — POTASSIUM PHOSPHATE MONOBASIC 500 MG PO TABS
500.0000 mg | ORAL_TABLET | ORAL | Status: DC
  Filled 2013-06-24 (×2): qty 1

## 2013-06-24 MED ORDER — SODIUM CHLORIDE 0.9 % IV SOLN
INTRAVENOUS | Status: DC
  Administered 2013-06-24: 17:00:00 via INTRAVENOUS

## 2013-06-24 MED ORDER — DEXTROSE 5 % IV SOLN
1.0000 g | Freq: Once | INTRAVENOUS | Status: AC
  Administered 2013-06-24: 1 g via INTRAVENOUS
  Filled 2013-06-24: qty 10

## 2013-06-24 MED ORDER — ALBUTEROL SULFATE HFA 108 (90 BASE) MCG/ACT IN AERS
1.0000 | INHALATION_SPRAY | Freq: Four times a day (QID) | RESPIRATORY_TRACT | Status: DC | PRN
  Filled 2013-06-24: qty 6.7

## 2013-06-24 MED ORDER — ONDANSETRON HCL 4 MG PO TABS: 4.0000 mg | ORAL_TABLET | Freq: Four times a day (QID) | ORAL | Status: DC | PRN

## 2013-06-24 NOTE — ED Provider Notes (Signed)
CSN: [id number]     Arrival date & time 06/24/13  1110 History  This chart was scribed for Donnetta HutchingBrian Clayton Jarmon, MD by Quintella ReichertMatthew Underwood, ED scribe.  This patient was seen in room APA09/APA09 and the patient's care was started at 12:05 PM.   Chief Complaint  Patient presents with  . Nausea    The history is provided by the patient. No language interpreter was used.    HPI Comments: Stanley Thompson is a 48 y.o. male with h/o alcoholic pancreatitis, asthma, COPD and bronchitis who presents to the Emergency Department complaining of 3 days of nausea and vomiting.  Pt states he was vomiting every 30 minutes yesterday and has been unable to tolerate any food or fluids.  His vomiting has subsided somewhat today.  However today he has also developed diarrhea.  He feels dehydrated and lightheaded.  Pt also reports hematuria.  He states he last urinated several minutes ago and his urine was "red."  He is on Xarelto due to a PE after a recent cholecystectomy, and was recently reduced from 30mg  to 20mg  Xarelto.  He has no known h/o DM.  He states he smokes rarely.   Past Medical History  Diagnosis Date  . Asthma   . Pneumonia   . Bronchitis   . COPD (chronic obstructive pulmonary disease)   . Shortness of breath   . Alcoholic pancreatitis     Past Surgical History  Procedure Laterality Date  . [facility]  . Orif mandibular [facility]  [facility]  . Cholecystectomy N/A 10/07/2012    Procedure: ATTEMPTED LAPAROSCOPIC CHOLECYSTECTOMY;  Surgeon: Marlane HatcherWilliam S Bradford, MD;  Location: [facility];  Service: General;  Laterality: N/A;  decision to open at 0843; converted to open start at 0852  . Cholecystectomy N/A 10/07/2012    Procedure: CHOLECYSTECTOMY;  Surgeon: Marlane HatcherWilliam S Bradford, MD;  Location: [facility];  Service: General;  Laterality: N/A;  converted to open; start at 0852    Family History  Problem Relation Age of Onset  . Hypertension Mother   . Cancer Mother    . Hypertension Father   . Heart failure Father   . Cancer Father     History  Substance Use Topics  . Smoking status: Current Every Day Smoker -- 32 years    Types: Cigarettes    Last Attempt to Quit: 07/19/2012  . Smokeless tobacco: Former NeurosurgeonUser     Comment: 4 packs of cigarettes per week   . Alcohol Use: No     Comment: former drinker, last drink was 08/17/12     Review of Systems A complete 10 system review of systems was obtained and all systems are negative except as noted in the HPI and PMH. -   Allergies  Advair diskus  Home Medications   Prior to Admission medications   Medication Sig Start Date End Date Taking? Authorizing Provider  albuterol (PROVENTIL) (2.5 MG/3ML) 0.083% nebulizer solution Take 3 mLs (2.5 mg total) by nebulization every 6 (six) hours as needed for wheezing. For shortness of breath/difficulty breathing 08/23/12   Wilson SingerNimish C Gosrani, MD  albuterol-ipratropium (COMBIVENT) 18-103 MCG/ACT inhaler Inhale 2 puffs into the lungs every 6 (six) hours as needed for wheezing or shortness of breath.    Historical Provider, MD  ferrous sulfate 325 (65 FE) MG tablet Take 325 mg by mouth daily.    Historical Provider, MD  fluticasone (FLOVENT HFA) 44 MCG/ACT  inhaler Inhale 2 puffs into the lungs 3 (three) times daily.    Historical Provider, MD  folic acid (FOLVITE) 1 MG tablet Take 1 mg by mouth every other day.    Historical Provider, MD  furosemide (LASIX) 20 MG tablet Take 20 mg by mouth daily.    Historical Provider, MD  ondansetron (ZOFRAN ODT) 4 MG disintegrating tablet Take 1 tablet (4 mg total) by mouth every 8 (eight) hours as needed. 01/20/13   Shelda Jakes, MD  potassium phosphate, monobasic, (K-PHOS ORIGINAL) 500 MG tablet Take 500 mg by mouth every other day.     Historical Provider, MD  promethazine (PHENERGAN) 25 MG tablet Take 1 tablet (25 mg total) by mouth every 6 (six) hours as needed for nausea or vomiting. 01/20/13   Shelda Jakes, MD   rivaroxaban (XARELTO) 20 MG TABS tablet Take 1 tablet (20 mg total) by mouth daily with supper. Start this dose on 11/03/12 PM, after completing the two times daily dose. 11/03/12   Elease Etienne, MD  senna (SENOKOT) 8.6 MG TABS tablet Take 2 tablets (17.2 mg total) by mouth daily. 10/14/12   Elease Etienne, MD  tetrahydrozoline (VISINE) 0.05 % ophthalmic solution Place 1 drop into both eyes daily as needed (dry eyes).    Historical Provider, MD   BP 118/102  Pulse 126  Temp(Src) 98.5 F (36.9 C) (Oral)  Ht 6' 2.5" (1.892 m)  Wt 218 lb (98.884 kg)  BMI 27.62 kg/m2  SpO2 99%   Physical Exam  Nursing note and vitals reviewed. Constitutional: He is oriented to person, place, and time. He appears well-developed and well-nourished.  HENT:  Head: Normocephalic and atraumatic.  Mouth/Throat: Mucous membranes are dry.  Eyes: Conjunctivae and EOM are normal. Pupils are equal, round, and reactive to light.  Neck: Normal range of motion. Neck supple.  Cardiovascular: Normal rate, regular rhythm and normal heart sounds.   Pulmonary/Chest: Effort normal and breath sounds normal.  Abdominal: Soft. Bowel sounds are normal. There is no tenderness.  Musculoskeletal: Normal range of motion.  Neurological: He is alert and oriented to person, place, and time.  Skin: Skin is warm and dry. There is pallor.  Psychiatric: He has a normal mood and affect. His behavior is normal.    ED Course  Procedures (including critical care time)  DIAGNOSTIC STUDIES: Oxygen Saturation is 99% on room air, normal by my interpretation.    COORDINATION OF CARE: 12:10 PM-Discussed treatment plan which includes IV fluids, bloodwork, and UA with pt at bedside and pt agreed to plan.    Results for orders placed during the hospital encounter of 06/24/13  URINALYSIS, ROUTINE W REFLEX MICROSCOPIC      Result Value Ref Range   Color, Urine ORANGE (*) YELLOW   APPearance CLOUDY (*) CLEAR   Specific Gravity, Urine 1.010   1.005 - 1.030   pH 7.5  5.0 - 8.0   Glucose, UA 100 (*) NEGATIVE mg/dL   Hgb urine dipstick NEGATIVE  NEGATIVE   Bilirubin Urine LARGE (*) NEGATIVE   Ketones, ur 15 (*) NEGATIVE mg/dL   Protein, ur 30 (*) NEGATIVE mg/dL   Urobilinogen, UA >0.9 (*) 0.0 - 1.0 mg/dL   Nitrite POSITIVE (*) NEGATIVE   Leukocytes, UA TRACE (*) NEGATIVE  CBC WITH DIFFERENTIAL      Result Value Ref Range   WBC 6.4  4.0 - 10.5 K/uL   RBC 3.52 (*) 4.22 - 5.81 MIL/uL   Hemoglobin 13.1  13.0 -  17.0 g/dL   HCT 41.337.7 (*) 24.439.0 - 01.052.0 %   MCV 107.1 (*) 78.0 - 100.0 fL   MCH 37.2 (*) 26.0 - 34.0 pg   MCHC 34.7  30.0 - 36.0 g/dL   RDW 27.215.7 (*) 53.611.5 - 64.415.5 %   Platelets 84 (*) 150 - 400 K/uL   Neutrophils Relative % 73  43 - 77 %   Neutro Abs 4.7  1.7 - 7.7 K/uL   Lymphocytes Relative 16  12 - 46 %   Lymphs Abs 1.0  0.7 - 4.0 K/uL   Monocytes Relative 10  3 - 12 %   Monocytes Absolute 0.7  0.1 - 1.0 K/uL   Eosinophils Relative 1  0 - 5 %   Eosinophils Absolute 0.1  0.0 - 0.7 K/uL   Basophils Relative 0  0 - 1 %   Basophils Absolute 0.0  0.0 - 0.1 K/uL  COMPREHENSIVE METABOLIC PANEL      Result Value Ref Range   Sodium 135 (*) 137 - 147 mEq/L   Potassium 3.1 (*) 3.7 - 5.3 mEq/L   Chloride 87 (*) 96 - 112 mEq/L   CO2 33 (*) 19 - 32 mEq/L   Glucose, Bld 103 (*) 70 - 99 mg/dL   BUN 7  6 - 23 mg/dL   Creatinine, Ser 0.340.63  0.50 - 1.35 mg/dL   Calcium 8.9  8.4 - 74.210.5 mg/dL   Total Protein 7.3  6.0 - 8.3 g/dL   Albumin 3.4 (*) 3.5 - 5.2 g/dL   AST 595203 (*) 0 - 37 U/L   ALT 49  0 - 53 U/L   Alkaline Phosphatase 208 (*) 39 - 117 U/L   Total Bilirubin 3.2 (*) 0.3 - 1.2 mg/dL   GFR calc non Af Amer >90  >90 mL/min   GFR calc Af Amer >90  >90 mL/min  LIPASE, BLOOD      Result Value Ref Range   Lipase 59  11 - 59 U/L  URINE MICROSCOPIC-ADD ON      Result Value Ref Range   Squamous Epithelial / LPF RARE  RARE   WBC, UA 0-2  <3 WBC/hpf   Bacteria, UA RARE  RARE     MDM   Final diagnoses:  Gastroenteritis   Elevated liver enzymes  UTI (urinary tract infection)    Patient is dehydrated. Liver functions and bilirubin elevated. Urinalysis shows evidence of infection.  IV fluids IV Rocephin. Admit to general medicine.    I personally performed the services described in this documentation, which was scribed in my presence. The recorded information has been reviewed and is accurate.    Donnetta HutchingBrian Jourdain Guay, MD 06/24/13 [phone]

## 2013-06-24 NOTE — ED Notes (Signed)
Nausea and vomiting , diarrhea 

## 2013-06-24 NOTE — H&P (Addendum)
[facility] History and Physical  Stanley NimrodJeffrey S Lintner ZOX:[id number] DOB: [birth year] DOA: 06/24/2013  Referring physician: Dr. Adriana [place], ER. PCP: Calla KicksABEZUDO, IGNACIO, MD   Chief Complaint: Nausea, vomiting and abdominal pain.  HPI: Stanley Thompson is a 48 y.o. male  This is a 48 year old man who has a history of alcohol abuse in the past and now presents with nausea, vomiting and abdominal pain, mostly on the right side, which has been present for the last 3-4 days. He was admitted in August of 2014 for elective open cholecystectomy for choledocholithiasis. Postoperative course was complicated by acute hypoxic respiratory failure secondary to pulmonary emboli and pneumonia. He was started on Xarelto  and has remained on this since that time. Unfortunately, it appears that he continues to still keep drinking alcohol, although he says he is not been drinking as much as he use to. There is no alteration in mental status. He is now being admitted for his intractable symptoms and abnormal liver enzymes.   Review of Systems:  Constitutional:  No weight loss, night sweats, Fevers, chills, fatigue.  HEENT:  No headaches, Difficulty swallowing,Tooth/dental problems,Sore throat,  No sneezing, itching, ear ache, nasal congestion, post nasal drip,  Cardio-vascular:  No chest pain, Orthopnea, PND, swelling in lower extremities, anasarca, dizziness, palpitations   Resp:  No shortness of breath with exertion or at rest. No excess mucus, no productive cough, No non-productive cough, No coughing up of blood.No change in color of mucus.No wheezing.No chest wall deformity  Skin:  no rash or lesions.  GU:  no dysuria, change in color of urine, no urgency or frequency. No flank pain.  Musculoskeletal:  No joint pain or swelling. No decreased range of motion. No back pain.  Psych:  No change in mood or affect. No depression or anxiety. No memory loss.   Past Medical History  Diagnosis Date  . Asthma    . Pneumonia   . Bronchitis   . COPD (chronic obstructive pulmonary disease)   . Shortness of breath   . Alcoholic pancreatitis    Past Surgical History  Procedure Laterality Date  . [facility]  . Orif mandibular fracture  [facility]  . Cholecystectomy N/A 10/07/2012    Procedure: ATTEMPTED LAPAROSCOPIC CHOLECYSTECTOMY;  Surgeon: Marlane HatcherWilliam S Bradford, MD;  Location: [facility];  Service: General;  Laterality: N/A;  decision to open at 0843; converted to open start at 0852  . Cholecystectomy N/A 10/07/2012    Procedure: CHOLECYSTECTOMY;  Surgeon: Marlane HatcherWilliam S Bradford, MD;  Location: [facility];  Service: General;  Laterality: N/A;  converted to open; start at [phone]   Social History:  reports that he has been smoking Cigarettes.  He has been smoking about 0.00 packs per day for the past 32 years. He has quit using smokeless tobacco. He reports that he does not drink alcohol or use illicit drugs.  Allergies  Allergen Reactions  . Advair Diskus [Fluticasone-Salmeterol] Swelling    throat    Family History  Problem Relation Age of Onset  . Hypertension Mother   . Cancer Mother   . Hypertension Father   . Heart failure Father   . Cancer Father      Prior to Admission medications   Medication Sig Start Date End Date Taking? Authorizing Provider  albuterol (PROVENTIL HFA;VENTOLIN HFA) 108 (90 BASE) MCG/ACT inhaler Inhale 1-2 puffs into the lungs every 6 (six) hours as needed for wheezing or  shortness of breath.   Yes Historical Provider, MD  albuterol (PROVENTIL) (2.5 MG/3ML) 0.083% nebulizer solution Take 3 mLs (2.5 mg total) by nebulization every 6 (six) hours as needed for wheezing. For shortness of breath/difficulty breathing 08/23/12  Yes Nimish Normajean Glasgow, MD  albuterol-ipratropium (COMBIVENT) 18-103 MCG/ACT inhaler Inhale 2 puffs into the lungs every 6 (six) hours as needed for wheezing or shortness of breath.   Yes Historical Provider, MD    fluticasone (FLOVENT HFA) 44 MCG/ACT inhaler Inhale 2 puffs into the lungs 3 (three) times daily.   Yes Historical Provider, MD  folic acid (FOLVITE) 1 MG tablet Take 1 mg by mouth 2 (two) times a week.    Yes Historical Provider, MD  furosemide (LASIX) 20 MG tablet Take 20 mg by mouth daily.   Yes Historical Provider, MD  potassium phosphate, monobasic, (K-PHOS ORIGINAL) 500 MG tablet Take 500 mg by mouth 2 (two) times a week.    Yes Historical Provider, MD  rivaroxaban (XARELTO) 20 MG TABS tablet Take 1 tablet (20 mg total) by mouth daily with supper. Start this dose on 11/03/12 PM, after completing the two times daily dose. 11/03/12  Yes Elease Etienne, MD  tetrahydrozoline (VISINE) 0.05 % ophthalmic solution Place 1 drop into both eyes daily as needed (dry eyes).   Yes Historical Provider, MD   Physical Exam: Filed Vitals:   06/24/13 1701  BP: 125/81  Pulse: 90  Temp:   Resp: 20    BP 125/81  Pulse 90  Temp(Src) 98.5 F (36.9 C) (Oral)  Resp 20  Ht 6' 2.5" (1.892 m)  Wt 98.884 kg (218 lb)  BMI 27.62 kg/m2  SpO2 96%  General:  Appears calm and comfortable. He does not appear to be clinically jaundiced. He does not have signs of chronic liver disease, in particular he does not have clubbing, spider nevi, peripheral edema. Eyes: PERRL, normal lids, irises & conjunctiva ENT: grossly normal hearing, lips & tongue Neck: no LAD, masses or thyromegaly Cardiovascular: RRR, no m/r/g. No LE edema. Telemetry: SR, no arrhythmias  Respiratory: CTA bilaterally, no w/r/r. Normal respiratory effort. Abdomen: soft, ntnd. He does appear to have hepatosplenomegaly by my examination. Skin: no rash or induration seen on limited exam Musculoskeletal: grossly normal tone BUE/BLE Psychiatric: grossly normal mood and affect, speech fluent and appropriate Neurologic: grossly non-focal.          Labs on Admission:  Basic Metabolic Panel:  Recent Labs Lab 06/24/13 1141  NA 135*  K 3.1*  CL  87*  CO2 33*  GLUCOSE 103*  BUN 7  CREATININE 0.63  CALCIUM 8.9   Liver Function Tests:  Recent Labs Lab 06/24/13 1141  AST 203*  ALT 49  ALKPHOS 208*  BILITOT 3.2*  PROT 7.3  ALBUMIN 3.4*    Recent Labs Lab 06/24/13 1141  LIPASE 59    CBC:  Recent Labs Lab 06/24/13 1141  WBC 6.4  NEUTROABS 4.7  HGB 13.1  HCT 37.7*  MCV 107.1*  PLT 84*      BNP (last 3 results)  Recent Labs  08/19/12 1318 01/20/13 1510  PROBNP 43.1 194.4*      Radiological Exams on Admission: No results found.    Assessment/Plan   1. Nausea and vomiting with abdominal pain. Unclear etiology. Abnormal liver enzymes with raised AST and alkaline phosphatase with increased total bilirubin. Lipase is normal. He has a previous history of cholecystectomy. 2. Continued alcohol consumption. He says that this is not in  excessive quantities but I am not quite sure of that. 3. Thrombocytopenia, likely related to alcoholism. 4. Macrocytosis, likely related to alcoholism. 5. Hypertension. 6. History of acute pulmonary embolism in August 2014, on continued anticoagulation. 7. Hypokalemia. 8. UTI.  Plan: 1. Admit to medical floor. 2. Ultrasound of the abdomen for further evaluation of elevated liver enzymes. 3. Intravenous fluids with potassium. Antiemetics as required. 4. Monitor liver enzymes closely. 5. Check B12, folate and TSH for his microcytosis. 6. Monitor for signs of alcohol withdrawal. I'm not going to start him on a alcohol withdrawal protocol at the present time unless he has clear symptoms of this. 7. Intravenous antibiotics for UTI.  Further recommendations will depend on patient's hospital progress. He may need gastroenterology consultation.  Code Status: Full code.   Family Communication: I discussed the plan with the patient at the bedside.   Disposition Plan: Home when medically stable.   Time spent: 45 minutes.  Wilson SingerNimish C Gosrani [facility] Pager  [phone].

## 2013-06-24 NOTE — ED Notes (Signed)
Pt c/o generalized abdominal pain as well as n/v/d x3 days.

## 2013-06-25 ENCOUNTER — Inpatient Hospital Stay (HOSPITAL_COMMUNITY): Payer: Self-pay

## 2013-06-25 DIAGNOSIS — K5289 Other specified noninfective gastroenteritis and colitis: Secondary | ICD-10-CM

## 2013-06-25 DIAGNOSIS — R197 Diarrhea, unspecified: Secondary | ICD-10-CM

## 2013-06-25 DIAGNOSIS — D649 Anemia, unspecified: Secondary | ICD-10-CM

## 2013-06-25 LAB — CBC
HCT: 31.2 % — ABNORMAL LOW (ref 39.0–52.0)
Hemoglobin: 10.8 g/dL — ABNORMAL LOW (ref 13.0–17.0)
MCH: 38.2 pg — ABNORMAL HIGH (ref 26.0–34.0)
MCHC: 34.6 g/dL (ref 30.0–36.0)
MCV: 110.2 fL — AB (ref 78.0–100.0)
Platelets: 63 10*3/uL — ABNORMAL LOW (ref 150–400)
RBC: 2.83 MIL/uL — AB (ref 4.22–5.81)
RDW: 15.6 % — ABNORMAL HIGH (ref 11.5–15.5)
WBC: 3.7 10*3/uL — AB (ref 4.0–10.5)

## 2013-06-25 LAB — COMPREHENSIVE METABOLIC PANEL
ALBUMIN: 2.8 g/dL — AB (ref 3.5–5.2)
ALT: 38 U/L (ref 0–53)
AST: 144 U/L — ABNORMAL HIGH (ref 0–37)
Alkaline Phosphatase: 156 U/L — ABNORMAL HIGH (ref 39–117)
BUN: 6 mg/dL (ref 6–23)
CALCIUM: 7.8 mg/dL — AB (ref 8.4–10.5)
CHLORIDE: 97 meq/L (ref 96–112)
CO2: 31 mEq/L (ref 19–32)
CREATININE: 0.63 mg/dL (ref 0.50–1.35)
GFR calc Af Amer: >90 mL/min (ref >90)
GFR calc non Af Amer: >90 mL/min (ref >90)
Glucose, Bld: 92 mg/dL (ref 70–99)
Potassium: 3.7 mEq/L (ref 3.7–5.3)
Sodium: 138 mEq/L (ref 137–147)
Total Bilirubin: 1.9 mg/dL — ABNORMAL HIGH (ref 0.3–1.2)
Total Protein: 5.9 g/dL — ABNORMAL LOW (ref 6.0–8.3)

## 2013-06-25 LAB — CLOSTRIDIUM DIFFICILE BY PCR: Toxigenic C. Difficile by PCR: NEGATIVE

## 2013-06-25 LAB — VITAMIN B12: VITAMIN B 12: 491 pg/mL (ref 211–911)

## 2013-06-25 LAB — PROTIME-INR
INR: 1.25 (ref 0.00–1.49)
PROTHROMBIN TIME: 15.4 s — AB (ref 11.6–15.2)

## 2013-06-25 LAB — TSH: TSH: 2.7 u[IU]/mL (ref 0.350–4.500)

## 2013-06-25 MED ORDER — LORAZEPAM 2 MG/ML IJ SOLN
1.0000 mg | Freq: Four times a day (QID) | INTRAMUSCULAR | Status: DC | PRN
  Administered 2013-06-26: 1 mg via INTRAVENOUS
  Filled 2013-06-25: qty 1

## 2013-06-25 MED ORDER — THIAMINE HCL 100 MG/ML IJ SOLN: 100.0000 mg | Freq: Every day | INTRAMUSCULAR | Status: DC

## 2013-06-25 MED ORDER — IPRATROPIUM-ALBUTEROL 0.5-2.5 (3) MG/3ML IN SOLN
3.0000 mL | Freq: Four times a day (QID) | RESPIRATORY_TRACT | Status: DC
  Administered 2013-06-25 – 2013-06-27 (×7): 3 mL via RESPIRATORY_TRACT
  Filled 2013-06-25 (×8): qty 3

## 2013-06-25 MED ORDER — LORAZEPAM 1 MG PO TABS
1.0000 mg | ORAL_TABLET | Freq: Four times a day (QID) | ORAL | Status: DC | PRN
  Administered 2013-06-25: 1 mg via ORAL
  Filled 2013-06-25: qty 1

## 2013-06-25 MED ORDER — LORAZEPAM 2 MG/ML IJ SOLN
0.0000 mg | Freq: Two times a day (BID) | INTRAMUSCULAR | Status: DC
  Administered 2013-06-27: 2 mg via INTRAVENOUS
  Filled 2013-06-25: qty 1

## 2013-06-25 MED ORDER — ADULT MULTIVITAMIN W/MINERALS CH
1.0000 | ORAL_TABLET | Freq: Every day | ORAL | Status: DC
  Administered 2013-06-25 – 2013-06-27 (×3): 1 via ORAL
  Filled 2013-06-25 (×3): qty 1

## 2013-06-25 MED ORDER — LORAZEPAM 2 MG/ML IJ SOLN
0.0000 mg | Freq: Four times a day (QID) | INTRAMUSCULAR | Status: AC
  Administered 2013-06-25: 2 mg via INTRAVENOUS
  Administered 2013-06-26: 1 mg via INTRAVENOUS
  Filled 2013-06-25 (×2): qty 1

## 2013-06-25 MED ORDER — VITAMIN B-1 100 MG PO TABS
100.0000 mg | ORAL_TABLET | Freq: Every day | ORAL | Status: DC
Start: 2013-06-25 — End: 2013-06-27
  Administered 2013-06-25 – 2013-06-27 (×3): 100 mg via ORAL
  Filled 2013-06-25 (×3): qty 1

## 2013-06-25 NOTE — Progress Notes (Signed)
[facility] PROGRESS NOTE  Lorella NimrodJeffrey S Snarski EAV:[id number] DOB: 1965-05-01 DOA: 06/24/2013 PCP: Calla KicksABEZUDO, IGNACIO, MD  Assessment/Plan: 1. Nausea/vomiting/diarrhea 1. Pt reports continued diarrhea, overall improving since admission 2. Abd xray ordered - unremarkable 3. Normal lipase 4. Liver US unremarkable - does show fatty liver 5. Suspect possible infectious gastroenteritis. Cdiff neg 6. Currently on clear liquid diet - will advance as tolerated 2. UTI 1. On empiric rocephin 2. Urine cx pending 3. Afebrile 4. No leukocytosis, but there is mild leukopenia (related to ETOH abuse?) 3. ETOH abuse 1. Pt with macrocytic anemia with thrombocytopenia which raises the concerns for ETOH abuse 2. Admits to previously drinking heavily, but now drinks "every now and then." 3. CIWA ordered 4. HTN 1. BP stable 5. DVT prophylaxis 1. scd's  Code Status: full Family Communication: Pt in room Disposition Plan: Home in next 24hrs?   Consultants:  none  Procedures:  none  Antibiotics:  cephtriaxone 4/25>>> (indicate start date, and stop date if known)  HPI/Subjective: Still reports diarrhea  Objective: Filed Vitals:   06/24/13 2314 06/25/13 0059 06/25/13 0730 06/25/13 0915  BP: 146/89   128/79  Pulse: 93   104  Temp: 99.1 F (37.3 C)   98.4 F (36.9 C)  TempSrc: Oral   Oral  Resp: 20   16  Height:      Weight:      SpO2: 95% 96% 94% 95%    Intake/Output Summary (Last 24 hours) at 06/25/13 1108 Last data filed at 06/25/13 0800  Gross per 24 hour  Intake 1368.33 ml  Output    700 ml  Net 668.33 ml   Filed Weights   06/24/13 1135  Weight: 98.884 kg (218 lb)    Exam:   General:  Awake, in nad  Cardiovascular: regular, s1, s2  Respiratory: normal resp effort, no wheezing  Abdomen: soft, nondistended  Musculoskeletal: perfused, no clubbing   Data Reviewed: Basic Metabolic Panel:  Recent Labs Lab 06/24/13 1141 06/25/13 0525  NA 135* 138  K  3.1* 3.7  CL 87* 97  CO2 33* 31  GLUCOSE 103* 92  BUN 7 6  CREATININE 0.63 0.63  CALCIUM 8.9 7.8*   Liver Function Tests:  Recent Labs Lab 06/24/13 1141 06/25/13 0525  AST 203* 144*  ALT 49 38  ALKPHOS 208* 156*  BILITOT 3.2* 1.9*  PROT 7.3 5.9*  ALBUMIN 3.4* 2.8*    Recent Labs Lab 06/24/13 1141  LIPASE 59   No results found for this basename: AMMONIA,  in the last 168 hours CBC:  Recent Labs Lab 06/24/13 1141 06/25/13 0525  WBC 6.4 3.7*  NEUTROABS 4.7  --   HGB 13.1 10.8*  HCT 37.7* 31.2*  MCV 107.1* 110.2*  PLT 84* 63*   Cardiac Enzymes: No results found for this basename: CKTOTAL, CKMB, CKMBINDEX, TROPONINI,  in the last 168 hours BNP (last 3 results)  Recent Labs  08/19/12 1318 01/20/13 1510  PROBNP 43.1 194.4*   CBG: No results found for this basename: GLUCAP,  in the last 168 hours  No results found for this or any previous visit (from the past 240 hour(s)).   Studies: [place] Abdomen Complete  06/25/2013   CLINICAL DATA:  Elevated LFTs, prior cholecystectomy  EXAM: ULTRASOUND ABDOMEN COMPLETE  COMPARISON:  CT abdomen pelvis dated 01/20/2013  FINDINGS: Gallbladder:  Surgically absent.  Common bile duct:  Diameter: 6 mm  Liver:  Hyperechoic hepatic parenchyma, reflecting hepatic steatosis. No focal hepatic lesion is seen.  IVC:  No abnormality visualized.  Pancreas:  Poorly visualized due to overlying bowel gas.  Spleen:  Measures 10.0 cm.  Right Kidney:  Length: 13.0 cm.  No mass or hydronephrosis.  Left Kidney:  Length: 11.3 cm.  No mass or hydronephrosis.  Abdominal aorta:  No aneurysm visualized.  Other findings:  None.  IMPRESSION: Hepatic steatosis.  Prior cholecystectomy.   Electronically Signed   By: Charline BillsSriyesh  Krishnan M.D.   On: 06/25/2013 11:00    Scheduled Meds: . cefTRIAXone (ROCEPHIN)  IV  1 g Intravenous Q24H  . fluticasone  2 puff Inhalation BID  . [START ON 06/26/2013] folic acid  1 mg Oral Once per day on Mon Thu  .  ipratropium-albuterol  3 mL Inhalation Q6H WA  . LORazepam  0-4 mg Intravenous Q6H   Followed by  . [START ON 06/27/2013] LORazepam  0-4 mg Intravenous Q12H  . multivitamin with minerals  1 tablet Oral Daily  . polycarbophil  625 mg Oral TID  . [START ON 06/26/2013] potassium phosphate (monobasic)  500 mg Oral Once per day on Mon Thu  . rivaroxaban  20 mg Oral Q supper  . thiamine  100 mg Oral Daily   Or  . thiamine  100 mg Intravenous Daily   Continuous Infusions: . 0.9 % NaCl with KCl 40 mEq / L 100 mL/hr at 06/24/13 1819    Active Problems:   Alcohol abuse   Hypertension   Acute pulmonary embolism   Nausea and vomiting   Elevated liver enzymes  Time spent: 35min  Jerald KiefStephen K Zema Lizardo  [facility] Pager [phone]. If 7PM-7AM, please contact night-coverage at [email or web address], password [email or web address] 06/25/2013, 11:08 AM  LOS: 1 day

## 2013-06-26 LAB — URINE CULTURE
Colony Count: NO GROWTH
Culture: NO GROWTH

## 2013-06-26 LAB — FOLATE RBC: RBC FOLATE: 426 ng/mL (ref >280)

## 2013-06-26 MED ORDER — K PHOS MONO-SOD PHOS DI & MONO 155-852-130 MG PO TABS
500.0000 mg | ORAL_TABLET | ORAL | Status: DC
  Administered 2013-06-26: 500 mg via ORAL
  Filled 2013-06-26: qty 2

## 2013-06-26 MED ORDER — K PHOS MONO-SOD PHOS DI & MONO 155-852-130 MG PO TABS: 500.0000 mg | ORAL_TABLET | ORAL | Status: DC

## 2013-06-26 NOTE — Progress Notes (Signed)
[facility] PROGRESS NOTE  Stanley NimrodJeffrey S Thompson ZOX:[id number] DOB: 05/18/1965 DOA: 06/24/2013 PCP: Calla KicksABEZUDO, IGNACIO, MD  Assessment/Plan: 1. Nausea/vomiting/diarrhea 1. Pt reports continued diarrhea, overall improving since admission; no further n/v and is tolerating a soli diet without issues. 2. Abd xray ordered - unremarkable 3. Normal lipase 4. Liver US unremarkable - does show fatty liver 5. Suspect possible infectious gastroenteritis. Cdiff neg  2. UTI 1. On empiric rocephin 2. Urine cx pending 3. Afebrile 4. No leukocytosis, but there is mild leukopenia (related to ETOH abuse?)  3. ETOH abuse 1. Pt with macrocytic anemia with thrombocytopenia which raises the concerns for ETOH abuse 2. Admits to previously drinking heavily, but now drinks "every now and then." 3. CIWA ordered  4. HTN 1. BP stable  5. DVT prophylaxis 1. scd's  Code Status: full Family Communication: Pt in room Disposition Plan: Home in next 24-48 hours.   Consultants:  none  Procedures:  none  Antibiotics:  cephtriaxone 4/25>>>   HPI/Subjective: Still reports diarrhea  Objective: Filed Vitals:   06/26/13 0715 06/26/13 0719 06/26/13 1407 06/26/13 1554  BP:  152/92  153/86  Pulse:  85  76  Temp:  98.5 F (36.9 C)  98.6 F (37 C)  TempSrc:  Oral  Oral  Resp:  20  20  Height:      Weight:      SpO2: 96% 99% 97% 97%    Intake/Output Summary (Last 24 hours) at 06/26/13 1632 Last data filed at 06/26/13 1553  Gross per 24 hour  Intake    960 ml  Output    950 ml  Net     10 ml   Filed Weights   06/24/13 1135  Weight: 98.884 kg (218 lb)    Exam:   General:  Awake, in nad  Cardiovascular: regular, s1, s2  Respiratory: normal resp effort, no wheezing  Abdomen: soft, nondistended  Musculoskeletal: perfused, no clubbing   Data Reviewed: Basic Metabolic Panel:  Recent Labs Lab 06/24/13 1141 06/25/13 0525  NA 135* 138  K 3.1* 3.7  CL 87* 97  CO2 33* 31   GLUCOSE 103* 92  BUN 7 6  CREATININE 0.63 0.63  CALCIUM 8.9 7.8*   Liver Function Tests:  Recent Labs Lab 06/24/13 1141 06/25/13 0525  AST 203* 144*  ALT 49 38  ALKPHOS 208* 156*  BILITOT 3.2* 1.9*  PROT 7.3 5.9*  ALBUMIN 3.4* 2.8*    Recent Labs Lab 06/24/13 1141  LIPASE 59   No results found for this basename: AMMONIA,  in the last 168 hours CBC:  Recent Labs Lab 06/24/13 1141 06/25/13 0525  WBC 6.4 3.7*  NEUTROABS 4.7  --   HGB 13.1 10.8*  HCT 37.7* 31.2*  MCV 107.1* 110.2*  PLT 84* 63*   Cardiac Enzymes: No results found for this basename: CKTOTAL, CKMB, CKMBINDEX, TROPONINI,  in the last 168 hours BNP (last 3 results)  Recent Labs  08/19/12 1318 01/20/13 1510  PROBNP 43.1 194.4*   CBG: No results found for this basename: GLUCAP,  in the last 168 hours  Recent Results (from the past 240 hour(s))  CLOSTRIDIUM DIFFICILE BY PCR     Status: None   Collection Time    06/25/13 11:23 AM      Result Value Ref Range Status   C difficile by pcr NEGATIVE  NEGATIVE Final     Studies: [place] Abdomen Complete  06/25/2013   CLINICAL DATA:  Elevated LFTs, prior cholecystectomy  EXAM: ULTRASOUND  ABDOMEN COMPLETE  COMPARISON:  CT abdomen pelvis dated 01/20/2013  FINDINGS: Gallbladder:  Surgically absent.  Common bile duct:  Diameter: 6 mm  Liver:  Hyperechoic hepatic parenchyma, reflecting hepatic steatosis. No focal hepatic lesion is seen.  IVC:  No abnormality visualized.  Pancreas:  Poorly visualized due to overlying bowel gas.  Spleen:  Measures 10.0 cm.  Right Kidney:  Length: 13.0 cm.  No mass or hydronephrosis.  Left Kidney:  Length: 11.3 cm.  No mass or hydronephrosis.  Abdominal aorta:  No aneurysm visualized.  Other findings:  None.  IMPRESSION: Hepatic steatosis.  Prior cholecystectomy.   Electronically Signed   By: Charline BillsSriyesh  Krishnan M.D.   On: 06/25/2013 11:00   Dg Abd Portable 1v  06/25/2013   CLINICAL DATA:  Abdominal distension. Acute pancreatitis per  patient  EXAM: PORTABLE ABDOMEN - 1 VIEW  COMPARISON:  CT 01/20/2013  FINDINGS: No dilated loops of large or small bowel. There are surgical clips in the right abdomen. Small amount of gas within the sigmoid colon rectum. No pathologic calcifications. No organomegaly.  IMPRESSION: No acute abdominal findings by radiograph.  No bowel obstruction.   Electronically Signed   By: Genevive BiStewart  Edmunds M.D.   On: 06/25/2013 12:16    Scheduled Meds: . cefTRIAXone (ROCEPHIN)  IV  1 g Intravenous Q24H  . fluticasone  2 puff Inhalation BID  . folic acid  1 mg Oral Once per day on Mon Thu  . ipratropium-albuterol  3 mL Inhalation Q6H WA  . LORazepam  0-4 mg Intravenous Q6H   Followed by  . [START ON 06/27/2013] LORazepam  0-4 mg Intravenous Q12H  . multivitamin with minerals  1 tablet Oral Daily  . phosphorus  500 mg Oral Once per day on Mon Thu  . polycarbophil  625 mg Oral TID  . rivaroxaban  20 mg Oral Q supper  . thiamine  100 mg Oral Daily   Or  . thiamine  100 mg Intravenous Daily   Continuous Infusions: . 0.9 % NaCl with KCl 40 mEq / L 100 mL/hr at 06/26/13 1244    Active Problems:   Alcohol abuse   Hypertension   Acute pulmonary embolism   Nausea and vomiting   Elevated liver enzymes  Time spent: 35min  Stanley Thompson  [facility] Pager [phone]. If 7PM-7AM, please contact night-coverage at [email or web address], password [email or web address] 06/26/2013, 4:32 PM  LOS: 2 days

## 2013-06-27 DIAGNOSIS — I2699 Other pulmonary embolism without acute cor pulmonale: Secondary | ICD-10-CM

## 2013-06-27 DIAGNOSIS — J9600 Acute respiratory failure, unspecified whether with hypoxia or hypercapnia: Secondary | ICD-10-CM

## 2013-06-27 NOTE — Care Management Note (Unsigned)
    Page 1 of 1   06/27/2013     1:21:42 PM CARE MANAGEMENT NOTE 06/27/2013  Patient:  Stanley Thompson,Stanley Thompson   Account Number:  [id number]  Date Initiated:  06/27/2013  Documentation initiated by:  Rosemary HolmsOBSON,Hildreth Orsak  Subjective/Objective Assessment:   Pt admitted from home where he lives with his spouse. Confirmed pt'Thompson PCP in [place]. No identified HH needs.     Action/Plan:   Anticipated DC Date:     Anticipated DC Plan:  HOME/SELF CARE      DC Planning Services  CM consult      Choice offered to / List presented to:             Status of service:   Medicare Important Message given?   (If response is "NO", the following Medicare IM given date fields will be blank) Date Medicare IM given:   Date Additional Medicare IM given:    Discharge Disposition:    Per UR Regulation:    If discussed at Long Length of Stay Meetings, dates discussed:    Comments:  06/27/13 Rosemary HolmsAmy Leith Hedlund RN BSN CM

## 2013-06-27 NOTE — Progress Notes (Signed)
Instructions given on discharge medications,and follow up visits,patient verbalized understanding. No c/o pain or discomfort noted.Vital signs stable.Accompanied by staff to an awaiting vehicle.

## 2013-06-27 NOTE — Discharge Summary (Signed)
Physician Discharge Summary  Lorella NimrodJeffrey S Barbour EAV:[id number] DOB: November 18, 1965 DOA: 06/24/2013  PCP: Calla KicksABEZUDO, IGNACIO, MD  Admit date: 06/24/2013 Discharge date: 06/27/2013  Time spent: 45 minutes  Recommendations for Outpatient Follow-up:  -Will be discharged home today. -Advised to follow up with PCP in 2 weeks.   Discharge Diagnoses:  Active Problems:   Alcohol abuse   Hypertension   Acute pulmonary embolism   Nausea and vomiting   Elevated liver enzymes   Discharge Condition: Stable and improved  Filed Weights   06/24/13 1135  Weight: 98.884 kg (218 lb)    History of present illness:  This is a 48 year old man who has a history of alcohol abuse in the past and now presents with nausea, vomiting and abdominal pain, mostly on the right side, which has been present for the last 3-4 days. He was admitted in August of 2014 for elective open cholecystectomy for choledocholithiasis. Postoperative course was complicated by acute hypoxic respiratory failure secondary to pulmonary emboli and pneumonia. He was started on Xarelto and has remained on this since that time. Unfortunately, it appears that he continues to still keep drinking alcohol, although he says he is not been drinking as much as he use to. There is no alteration in mental status. He is now being admitted for his intractable symptoms and abnormal liver enzymes.   Hospital Course:   1. Nausea/vomiting/diarrhea  1. Clinically resolved.; no further n/v/diarrhea and is tolerating a solid diet without issues. 2. Abd xray ordered - unremarkable. 3. Normal lipase 4. Liver US unremarkable - does show fatty liver 5. Likely represents viral gastroenteritis.  2. ?UTI  1. Urine cx negative. 2. No further antibiotics on DC. 3. Afebrile  3. ETOH abuse  1. Pt with macrocytic anemia with thrombocytopenia which raises the concerns for ETOH abuse 2. Admits to previously drinking heavily, but now drinks "every now and  then." 3. CIWA ordered; no signs of withdrawals while in the hospital.  4. HTN  1. BP stable  5. DVT prophylaxis  1. scd's  Procedures:  None   Consultations:  None  Discharge Instructions  Discharge Orders   Future Orders Complete By Expires   Diet - low sodium heart healthy  As directed    Discontinue IV  As directed    Increase activity slowly  As directed        Medication List    STOP taking these medications       furosemide 20 MG tablet  Commonly known as:  LASIX      TAKE these medications       albuterol 108 (90 BASE) MCG/ACT inhaler  Commonly known as:  PROVENTIL HFA;VENTOLIN HFA  Inhale 1-2 puffs into the lungs every 6 (six) hours as needed for wheezing or shortness of breath.     COMBIVENT 18-103 MCG/ACT inhaler  Generic drug:  albuterol-ipratropium  Inhale 2 puffs into the lungs every 6 (six) hours as needed for wheezing or shortness of breath.     fluticasone 44 MCG/ACT inhaler  Commonly known as:  FLOVENT HFA  Inhale 2 puffs into the lungs 3 (three) times daily.     folic acid 1 MG tablet  Commonly known as:  FOLVITE  Take 1 mg by mouth 2 (two) times a week.     potassium phosphate (monobasic) 500 MG tablet  Commonly known as:  K-PHOS ORIGINAL  Take 500 mg by mouth 2 (two) times a week.     rivaroxaban 20 MG  Tabs tablet  Commonly known as:  XARELTO  Take 1 tablet (20 mg total) by mouth daily with supper. Start this dose on 11/03/12 PM, after completing the two times daily dose.     VISINE 0.05 % ophthalmic solution  Generic drug:  tetrahydrozoline  Place 1 drop into both eyes daily as needed (dry eyes).       Allergies  Allergen Reactions  . Advair Diskus [Fluticasone-Salmeterol] Swelling    throat       Follow-up Information   Follow up with Calla Kicks, MD. Schedule an appointment as soon as possible for a visit in 2 weeks.   Specialty:  General Practice   Contact information:   [address] [place]  [address] [phone]        The results of significant diagnostics from this hospitalization (including imaging, microbiology, ancillary and laboratory) are listed below for reference.    Significant Diagnostic Studies: US Abdomen Complete  06/25/2013   CLINICAL DATA:  Elevated LFTs, prior cholecystectomy  EXAM: ULTRASOUND ABDOMEN COMPLETE  COMPARISON:  CT abdomen pelvis dated 01/20/2013  FINDINGS: Gallbladder:  Surgically absent.  Common bile duct:  Diameter: 6 mm  Liver:  Hyperechoic hepatic parenchyma, reflecting hepatic steatosis. No focal hepatic lesion is seen.  IVC:  No abnormality visualized.  Pancreas:  Poorly visualized due to overlying bowel gas.  Spleen:  Measures 10.0 cm.  Right Kidney:  Length: 13.0 cm.  No mass or hydronephrosis.  Left Kidney:  Length: 11.3 cm.  No mass or hydronephrosis.  Abdominal aorta:  No aneurysm visualized.  Other findings:  None.  IMPRESSION: Hepatic steatosis.  Prior cholecystectomy.   Electronically Signed   By: Charline Bills M.D.   On: 06/25/2013 11:00   Dg Abd Portable 1v  06/25/2013   CLINICAL DATA:  Abdominal distension. Acute pancreatitis per patient  EXAM: PORTABLE ABDOMEN - 1 VIEW  COMPARISON:  CT 01/20/2013  FINDINGS: No dilated loops of large or small bowel. There are surgical clips in the right abdomen. Small amount of gas within the sigmoid colon rectum. No pathologic calcifications. No organomegaly.  IMPRESSION: No acute abdominal findings by radiograph.  No bowel obstruction.   Electronically Signed   By: Genevive Bi M.D.   On: 06/25/2013 12:16    Microbiology: Recent Results (from the past 240 hour(s))  URINE CULTURE     Status: None   Collection Time    06/24/13  5:16 PM      Result Value Ref Range Status   Specimen Description URINE, CLEAN CATCH   Final   Special Requests NONE   Final   Culture  Setup Time     Final   Value: 06/25/2013 20:41     Performed at [facility] Count     Final   Value: NO GROWTH      Performed at [facility]   Culture     Final   Value: NO GROWTH     Performed at [facility]   Report Status 06/26/2013 FINAL   Final  CLOSTRIDIUM DIFFICILE BY PCR     Status: None   Collection Time    06/25/13 11:23 AM      Result Value Ref Range Status   C difficile by pcr NEGATIVE  NEGATIVE Final     Labs: Basic Metabolic Panel:  Recent Labs Lab 06/24/13 1141 06/25/13 0525  NA 135* 138  K 3.1* 3.7  CL 87* 97  CO2  33* 31  GLUCOSE 103* 92  BUN 7 6  CREATININE 0.63 0.63  CALCIUM 8.9 7.8*   Liver Function Tests:  Recent Labs Lab 06/24/13 1141 06/25/13 0525  AST 203* 144*  ALT 49 38  ALKPHOS 208* 156*  BILITOT 3.2* 1.9*  PROT 7.3 5.9*  ALBUMIN 3.4* 2.8*    Recent Labs Lab 06/24/13 1141  LIPASE 59   No results found for this basename: AMMONIA,  in the last 168 hours CBC:  Recent Labs Lab 06/24/13 1141 06/25/13 0525  WBC 6.4 3.7*  NEUTROABS 4.7  --   HGB 13.1 10.8*  HCT 37.7* 31.2*  MCV 107.1* 110.2*  PLT 84* 63*   Cardiac Enzymes: No results found for this basename: CKTOTAL, CKMB, CKMBINDEX, TROPONINI,  in the last 168 hours BNP: BNP (last 3 results)  Recent Labs  08/19/12 1318 01/20/13 1510  PROBNP 43.1 194.4*   CBG: No results found for this basename: GLUCAP,  in the last 168 hours     Signed:  Henderson CloudEstela Y Hernandez Acosta  [facility] Pager: [phone] 06/27/2013, 12:45 PM

## 2013-10-29 ENCOUNTER — Encounter (HOSPITAL_COMMUNITY): Payer: Self-pay | Admitting: Emergency Medicine

## 2013-10-29 ENCOUNTER — Emergency Department (HOSPITAL_COMMUNITY): Payer: Self-pay

## 2013-10-29 ENCOUNTER — Emergency Department (HOSPITAL_COMMUNITY)
Admission: EM | Admit: 2013-10-29 | Discharge: 2013-10-29 | Disposition: A | Discharge status: Home / Self Care | Payer: Self-pay | Attending: Emergency Medicine | Admitting: Emergency Medicine

## 2013-10-29 DIAGNOSIS — J449 Chronic obstructive pulmonary disease, unspecified: Secondary | ICD-10-CM | POA: Insufficient documentation

## 2013-10-29 DIAGNOSIS — R109 Unspecified abdominal pain: Secondary | ICD-10-CM | POA: Insufficient documentation

## 2013-10-29 DIAGNOSIS — J4489 Other specified chronic obstructive pulmonary disease: Secondary | ICD-10-CM | POA: Insufficient documentation

## 2013-10-29 DIAGNOSIS — R197 Diarrhea, unspecified: Secondary | ICD-10-CM | POA: Insufficient documentation

## 2013-10-29 DIAGNOSIS — R112 Nausea with vomiting, unspecified: Secondary | ICD-10-CM | POA: Insufficient documentation

## 2013-10-29 DIAGNOSIS — F17200 Nicotine dependence, unspecified, uncomplicated: Secondary | ICD-10-CM | POA: Insufficient documentation

## 2013-10-29 DIAGNOSIS — Z8719 Personal history of other diseases of the digestive system: Secondary | ICD-10-CM | POA: Insufficient documentation

## 2013-10-29 DIAGNOSIS — Z79899 Other long term (current) drug therapy: Secondary | ICD-10-CM | POA: Insufficient documentation

## 2013-10-29 DIAGNOSIS — Z8701 Personal history of pneumonia (recurrent): Secondary | ICD-10-CM | POA: Insufficient documentation

## 2013-10-29 HISTORY — DX: Fatty (change of) liver, not elsewhere classified: K76.0

## 2013-10-29 LAB — CBC WITH DIFFERENTIAL/PLATELET
BASOS ABS: 0 10*3/uL (ref 0.0–0.1)
BASOS PCT: 0 % (ref 0–1)
EOS PCT: 1 % (ref 0–5)
Eosinophils Absolute: 0 10*3/uL (ref 0.0–0.7)
HCT: 42.2 % (ref 39.0–52.0)
Hemoglobin: 14 g/dL (ref 13.0–17.0)
Lymphocytes Relative: 23 % (ref 12–46)
Lymphs Abs: 1.3 10*3/uL (ref 0.7–4.0)
MCH: 30.6 pg (ref 26.0–34.0)
MCHC: 33.2 g/dL (ref 30.0–36.0)
MCV: 92.1 fL (ref 78.0–100.0)
Monocytes Absolute: 0.8 10*3/uL (ref 0.1–1.0)
Monocytes Relative: 14 % — ABNORMAL HIGH (ref 3–12)
NEUTROS ABS: 3.7 10*3/uL (ref 1.7–7.7)
Neutrophils Relative %: 63 % (ref 43–77)
Platelets: 67 10*3/uL — ABNORMAL LOW (ref 150–400)
RBC: 4.58 MIL/uL (ref 4.22–5.81)
RDW: 15.7 % — AB (ref 11.5–15.5)
WBC: 5.9 10*3/uL (ref 4.0–10.5)

## 2013-10-29 LAB — COMPREHENSIVE METABOLIC PANEL
ALBUMIN: 4.3 g/dL (ref 3.5–5.2)
ALT: 33 U/L (ref 0–53)
ANION GAP: 21 — AB (ref 5–15)
AST: 63 U/L — ABNORMAL HIGH (ref 0–37)
Alkaline Phosphatase: 120 U/L — ABNORMAL HIGH (ref 39–117)
BUN: 12 mg/dL (ref 6–23)
CO2: 28 mEq/L (ref 19–32)
Calcium: 9.5 mg/dL (ref 8.4–10.5)
Chloride: 87 mEq/L — ABNORMAL LOW (ref 96–112)
Creatinine, Ser: 0.67 mg/dL (ref 0.50–1.35)
GFR calc Af Amer: >90 mL/min (ref >90)
GFR calc non Af Amer: >90 mL/min (ref >90)
Glucose, Bld: 94 mg/dL (ref 70–99)
Potassium: 3.8 mEq/L (ref 3.7–5.3)
Sodium: 136 mEq/L — ABNORMAL LOW (ref 137–147)
TOTAL PROTEIN: 7.8 g/dL (ref 6.0–8.3)
Total Bilirubin: 1.5 mg/dL — ABNORMAL HIGH (ref 0.3–1.2)

## 2013-10-29 LAB — LACTIC ACID, PLASMA: LACTIC ACID, VENOUS: 1.4 mmol/L (ref 0.5–2.2)

## 2013-10-29 LAB — LIPASE, BLOOD: Lipase: 37 U/L (ref 11–59)

## 2013-10-29 LAB — TROPONIN I

## 2013-10-29 MED ORDER — ONDANSETRON 8 MG PO TBDP
8.0000 mg | ORAL_TABLET | Freq: Once | ORAL | Status: AC
  Administered 2013-10-29: 8 mg via ORAL
  Filled 2013-10-29: qty 1

## 2013-10-29 MED ORDER — IOHEXOL 300 MG/ML  SOLN: 25.0000 mL | Freq: Once | INTRAMUSCULAR | Status: DC | PRN

## 2013-10-29 MED ORDER — IOHEXOL 300 MG/ML  SOLN
50.0000 mL | Freq: Once | INTRAMUSCULAR | Status: AC | PRN
  Administered 2013-10-29: 50 mL via ORAL

## 2013-10-29 MED ORDER — FAMOTIDINE IN NACL 20-0.9 MG/50ML-% IV SOLN
20.0000 mg | Freq: Once | INTRAVENOUS | Status: AC
  Administered 2013-10-29: 20 mg via INTRAVENOUS
  Filled 2013-10-29: qty 50

## 2013-10-29 MED ORDER — ONDANSETRON HCL 4 MG/2ML IJ SOLN
4.0000 mg | Freq: Once | INTRAMUSCULAR | Status: AC
  Administered 2013-10-29: 4 mg via INTRAVENOUS
  Filled 2013-10-29: qty 2

## 2013-10-29 MED ORDER — SODIUM CHLORIDE 0.9 % IV SOLN
INTRAVENOUS | Status: DC
  Administered 2013-10-29: 1000 mL via INTRAVENOUS

## 2013-10-29 MED ORDER — IOHEXOL 300 MG/ML  SOLN
100.0000 mL | Freq: Once | INTRAMUSCULAR | Status: AC | PRN
  Administered 2013-10-29: 100 mL via INTRAVENOUS

## 2013-10-29 MED ORDER — ONDANSETRON HCL 4 MG PO TABS: 4.0000 mg | ORAL_TABLET | Freq: Three times a day (TID) | ORAL | Status: DC | PRN

## 2013-10-29 NOTE — ED Notes (Signed)
Pt complains of dizziness upon standing.

## 2013-10-29 NOTE — Discharge Instructions (Signed)
°Emergency Department Resource Guide °1) Find a Doctor and Pay Out of Pocket °Although you won't have to find out who is covered by your insurance plan, it is a good idea to ask around and get recommendations. You will then need to call the office and see if the doctor you have chosen will accept you as a new patient and what types of options they offer for patients who are self-pay. Some doctors offer discounts or will set up payment plans for their patients who do not have insurance, but you will need to ask so you aren't surprised when you get to your appointment. ° °2) Contact Your Local Health Department °Not all health departments have doctors that can see patients for sick visits, but many do, so it is worth a call to see if yours does. If you don't know where your local health department is, you can check in your phone book. The CDC also has a tool to help you locate your state's health department, and many state websites also have listings of all of their local health departments. ° °3) Find a Walk-in Clinic °If your illness is not likely to be very severe or complicated, you may want to try a walk in clinic. These are popping up all over the country in pharmacies, drugstores, and shopping centers. They're usually staffed by nurse practitioners or physician assistants that have been trained to treat common illnesses and complaints. They're usually fairly quick and inexpensive. However, if you have serious medical issues or chronic medical problems, these are probably not your best option. ° °No Primary Care Doctor: °- Call [facility] at  832-8000 - they can help you locate a primary care doctor that  accepts your insurance, provides certain services, etc. °- Physician Referral Service- 1-800-533-3463 ° °Chronic Pain Problems: °Organization         Address  Phone   Notes  °[facility]  (336) 297-2271 Patients need to be referred by their primary care doctor.  ° °Medication  Assistance: °Organization         Address  Phone   Notes  °[place] [facility] [address] °[place], [place] [address] (336) 641-8030 --Must be a resident of [place] °-- Must have NO insurance coverage whatsoever (no Medicaid/ Medicare, etc.) °-- The pt. MUST have a primary care doctor that directs their care regularly and follows them in the community °  °[facility]  (866) 331-1348   °[employer]  (888) 892-1162   ° °Agencies that provide inexpensive medical care: °Organization         Address  Phone   Notes  °[facility]  (336) 832-8035   °[facility]    (336) 832-7272   °[facility] [address] °[place], [place] [address] (336) 832-4777   °[facility] [address], °[place] (336) 271-4999   °[facility]    (336) 373-0678   °[facility]    (336) 272-1050   °[facility] ° [address], [place] Phone:  (336) 832-4444, Fax:  (336) 832-4440 Hours of Operation:  9 am - 6 pm, M-F.  Also accepts Medicaid/Medicare and self-pay.  °[facility] ° [address], [place] Phone: (336) 832-3150, Fax: (336) 832-3151. Hours of Operation:  8:30 am - 5:30 pm, M-F.  Also accepts Medicaid and self-pay.  °[facility] [place] [address], [place] Phone: (336) 878-6027   °[facility] [address], [place], [place] (336)723-1848, Ext. 123 Mondays & Thursdays: 7-9 AM.  First 15 patients are seen on a first come, first serve basis. °  ° °Medicaid-accepting [place] Providers: ° °Organization         Address  Phone   Notes  °[facility] [address], [place] (336) 641-2100 Also accepts self-pay patients.  °[facility] [address], [place] ° (336) 856-9996   °[facility] [address], [place]  (336) 288-8857   °[facility] [address] [place] [address], [place] (336) 299-7000   °Veita Bland [address], [place]  ° (336) 373-1557 Only accepts [place] Access Medicaid patients after they have their name applied to their card.  ° °Self-Pay (no insurance) in [place]: ° °Organization         Address  Phone   Notes  °Sickle Cell Patients, [facility] [address], [place] (336) 832-1970   °[facility] [address], [place] (336) 832-4400   °[facility] ° [address], [place] (336) 992-4800   °[facility]/Dr. Osei-Bonsu ° [address] [place] [address], [place] or [address], [place] (336) 841-8500 Phone number for both [place] and [place] locations is the same.  °[facility] [address], [place] (336) 299-0000   °[facility] [address] [place] [address], [place] or [address] (336) 852-7530 °(336) 878-2260   °[facility] [address], [place] (336) 350-1642, phone; (336) 294-5005, fax Sees patients 1st and 3rd Saturday of every month.  Must not qualify for public or private insurance (i.e. Medicaid, Medicare, [place] Health Choice, Veterans' Benefits) • Household income should be no more than 200% of the poverty level •The clinic cannot treat you if you are pregnant or think you are pregnant • Sexually transmitted diseases are not treated at the clinic.  ° ° °Dental Care: °Organization         Address  Phone  Notes  °[place] [facility] [address], [place] (336) 641-6152 Accepts children up to age 21 who are enrolled in Medicaid or [place] Health Choice; pregnant women with a Medicaid card; and children who have applied for Medicaid or [place] Health Choice, but were declined, whose parents can pay a reduced fee at time of service.  °[place]  [facility] [place]  [address], [place] (336) 641-7733 Accepts children up to age 21 who are enrolled in Medicaid or [place] Health Choice; pregnant women with a Medicaid card; and children who have applied for Medicaid or [place] Health Choice, but were declined, whose parents can pay a reduced fee at time of service.  °[facility] ° [address], [place] (336) 641-4533 Patients are seen by appointment only. Walk-ins are not accepted. [facility] will see patients 18 years of age and older. °Monday - Tuesday (8am-5pm) °Most Wednesdays (8:30-5pm) °$30 per visit, cash only  °[facility] ° [address], [place] (336) 641-4533 Patients are seen by appointment only. Walk-ins are not accepted. [facility] will see patients 18 years of age and older. °One   Wednesday Evening (Monthly: Volunteer Based).  $30 per visit, cash only  °[facility]  (919) 537-3737 for adults; Children under age 4, call Graduate Pediatric Dentistry at ([phone]) [phone]. Children aged 4-14, please call ([phone]) [phone] to request a pediatric application. ° Dental services are provided in all areas of dental care including fillings, crowns and bridges, complete and partial dentures, implants, gum treatment, root canals, and extractions. Preventive care is also provided. Treatment is provided to both adults and children. °Patients are selected via a lottery and there is often a waiting list. °  °[facility] [address], °[place] ° (336) 763-8833 [email or web address] °  °[facility] [address], [place], [place] (336)723-1848, Ext. 123 Second and Fourth Thursday of each month, opens at 6:30 AM; Clinic ends at 9 AM.  Patients are seen on a first-come first-served basis, and a limited number are seen during each clinic.  ° °[facility] ° [address], [place], [place] (336) 723-7904    Eligibility Requirements °You must have lived in [place], [place], or [place] counties for at least the last three months. °  You cannot be eligible for state or federal sponsored healthcare insurance, including Veterans Administration, Medicaid, or Medicare. °  You generally cannot be eligible for healthcare insurance through your employer.  °  How to apply: °Eligibility screenings are held every Tuesday and Wednesday afternoon from 1:00 pm until 4:00 pm. You do not need an appointment for the interview!  °[facility] [address], [place], [place] 336-631-2330   °[facility]  336-342-8273   °[facility]  336-703-3100   °[facility]  336-570-6415   ° °Behavioral Health Resources in the Community: °Intensive Outpatient Programs °Organization         Address  Phone  Notes  °[facility] [address], [place], [place] 336-878-6098   °[facility] [address], [place], [place] 336-832-9800   °[facility] [address], [place], [place] ° 336-882-2125   °[facility] [address],  °[place], [place] 1-800-853-5163 or 336-641-4981   °Substance Abuse Resources °Organization         Address  Phone  Notes  °[facility]  336-882-2125   °[facility]  336-784-9470   °[facility]  336-285-9073   °[facility]  336-845-3988   °Residential & Outpatient Substance Abuse Program  1-800-659-3381   °Psychological Services °Organization         Address  Phone  Notes  °[facility]  336- 832-9600   °[facility]  336- 378-7881   °[facility] [address], [place] 1-800-853-5163 or 336-641-4981   ° °Mobile Crisis Teams °Organization         Address  Phone  Notes  °[facility], Mobile Crisis Care Unit  1-877-626-1772   °Assertive °[facility] ° [address]  [place], [place] 336-834-9664   °Sharon DeEsch [address] °[place] 336-554-5454   ° °Self-Help/Support Groups °Organization         Address  Phone             Notes  °[facility]  - variety of support groups  336- 373-1402 Call for more information  °Narcotics Anonymous (NA), [facility] [address], °[place]  2 meetings at this location  ° °  Residential Treatment Programs Organization         Address  Phone  Notes  [facility] [address]    [place]  4-098-119-1478   [facility]  [address] [place] [address], [place], [place] 308-657-8469   [facility] [address] [place], [place] 629-528-4132 Admissions: 8am-3pm M-F  [facility] [address],    [place], [place] 440-102-7253   [facility] [address] [place], [place], [place] 664-403-4742   [facility] [address]  [place], [place] 595-638-7564   [facility] - Intensive Outpatient [address], [place], [place] 332-951-8841   [facility] ([facility] [address] [place].,  [place], [place] 6-606-301-6010 or 516-045-1240   [facility] [address], [place], [place] 025-427-0623 Accepts Medicaid  [facility] [address]  [place] 7-628-315-1761 Substance Abuse/Addiction Treatment   [facility] Organization         Address  Phone  Notes  [facility]  838-553-3551   Angie Fava, PhD [address] [place], [place]   (860)595-3166 or (949) 751-2612   [facility]   [address] [place], [place] 854-368-9392   [facility] [address] [place], [place] 604-862-1346 Insurance/Medicaid/sponsorship through [facility] [address]                                    [place], [place] (223) [phone] Therapy/tele-psych/case    [facility] [address], [place] (502)524-9102    Dr. Lolly Mustache  939-255-9956   [facility]  [employer] [facility] Dept. 1) [address], [place] 2) [address] [place] 3)  [address], [place] (551) [phone] (602)[phone]  (289)476-1185   [facility] Child Abuse Hotline 7376591978 or (956)184-5927 (After Hours)       Take the prescription as directed.  Increase your fluid intake (ie:  Gatoraide) for the next few days, as discussed.  Eat a bland diet and advance to your regular diet slowly as you can tolerate it.   Avoid full strength juices, as well as milk and milk products until your diarrhea has resolved.   Call your regular medical doctor tomorrow morning to schedule a follow up appointment in the next 2 days.  Return to the Emergency Department immediately if not improving (or even worsening) despite taking the medicines as prescribed, any black or bloody stool or vomit, if you develop a fever over "101," or for any other concerns.

## 2013-10-29 NOTE — ED Provider Notes (Signed)
CSN: [id number]     Arrival date & time 10/29/13  1540 History   First MD Initiated Contact with Patient 10/29/13 1636     Chief Complaint  Patient presents with  . Emesis  . Diarrhea  . Abdominal Pain      HPI Pt was seen at 1645. Per pt, c/o gradual onset and persistence of multiple intermittent episodes of N/V/D that began 3 days ago. Describes the stools as "watery." Describes the emesis as "yellow." Has been associated with lightheadedness and generalized "aching" abd pain, esp RLQ. States his lower chest and abd "hurt from all the vomiting." Denies CP/SOB, no back pain, no fevers, no black or blood in stools or emesis.     Past Medical History  Diagnosis Date  . Asthma   . Pneumonia   . Bronchitis   . COPD (chronic obstructive pulmonary disease)   . Shortness of breath   . Alcoholic pancreatitis   . Fatty liver    Past Surgical History  Procedure Laterality Date  . Back surgery  1984    [facility]  . Orif mandibular fracture  2004    [facility]  . Cholecystectomy N/A 10/07/2012    Procedure: ATTEMPTED LAPAROSCOPIC CHOLECYSTECTOMY;  Surgeon: Marlane Hatcher, MD;  Location: [facility];  Service: General;  Laterality: N/A;  decision to open at 0843; converted to open start at 0852  . Cholecystectomy N/A 10/07/2012    Procedure: CHOLECYSTECTOMY;  Surgeon: Marlane Hatcher, MD;  Location: [facility];  Service: General;  Laterality: N/A;  converted to open; start at [phone]  . Cholecystectomy     Family History  Problem Relation Age of Onset  . Hypertension Mother   . Cancer Mother   . Hypertension Father   . Heart failure Father   . Cancer Father    History  Substance Use Topics  . Smoking status: Current Every Day Smoker -- 1.00 packs/day for 32 years    Types: Cigarettes    Last Attempt to Quit: 07/19/2012  . Smokeless tobacco: Former Neurosurgeon     Comment: 4 packs of cigarettes per week   . Alcohol Use: No     Comment: former drinker, last drink was 08/17/12     Review of Systems ROS: Statement: All systems negative except as marked or noted in the HPI; Constitutional: Negative for fever and chills. ; ; Eyes: Negative for eye pain, redness and discharge. ; ; ENMT: Negative for ear pain, hoarseness, nasal congestion, sinus pressure and sore throat. ; ; Cardiovascular: Negative for palpitations, diaphoresis, dyspnea and peripheral edema. ; ; Respiratory: Negative for cough, wheezing and stridor. ; ; Gastrointestinal: +N/V/D, abd pain. Negative for blood in stool, hematemesis, jaundice and rectal bleeding. . ; ; Genitourinary: Negative for dysuria, flank pain and hematuria. ; ; Musculoskeletal: Negative for back pain and neck pain. Negative for swelling and trauma.; ; Skin: Negative for pruritus, rash, abrasions, blisters, bruising and skin lesion.; ; Neuro: +lightheadedness. Negative for headache and neck stiffness. Negative for weakness, altered level of consciousness , altered mental status, extremity weakness, paresthesias, involuntary movement, seizure and syncope.      Allergies  Advair diskus  Home Medications   Prior to Admission medications   Medication Sig Start Date End Date Taking? Authorizing Provider  albuterol (PROVENTIL HFA;VENTOLIN HFA) 108 (90 BASE) MCG/ACT inhaler Inhale 1-2 puffs into the lungs every 6 (six) hours as needed for wheezing or shortness of breath.   Yes Historical Provider, MD  albuterol-ipratropium (COMBIVENT) 18-103 MCG/ACT inhaler Inhale 2 puffs into the lungs every 6 (six) hours as needed for wheezing or shortness of breath.   Yes Historical Provider, MD  B Complex Vitamins (VITAMIN B COMPLEX PO) Take 1 tablet by mouth daily.   Yes Historical Provider, MD  fluticasone (FLOVENT HFA) 44 MCG/ACT inhaler Inhale 2 puffs into the lungs 3 (three) times daily as needed (Shortness of Breath).    Yes Historical Provider, MD  folic acid (FOLVITE) 1 MG tablet Take 1 mg by mouth 2 (two) times a week.    Yes Historical Provider, MD   furosemide (LASIX) 20 MG tablet Take 20 mg by mouth daily.   Yes Historical Provider, MD  potassium phosphate, monobasic, (K-PHOS ORIGINAL) 500 MG tablet Take 500 mg by mouth 2 (two) times a week.    Yes Historical Provider, MD  rivaroxaban (XARELTO) 20 MG TABS tablet Take 20 mg by mouth daily with supper. 11/03/12  Yes Elease Etienne, MD   BP 154/84  Pulse 92  Temp(Src) 99 F (37.2 C) (Oral)  Resp 18  Ht  (1.93 m)  Wt 235 lb (106.595 kg)  BMI 28.62 kg/m2  SpO2 98% Physical Exam 1650: Physical examination:  Nursing notes reviewed; Vital signs and O2 SAT reviewed;  Constitutional: Well developed, Well nourished, In no acute distress; Head:  Normocephalic, atraumatic; Eyes: EOMI, PERRL, No scleral icterus; ENMT: Mouth and pharynx normal, Mucous membranes dry; Neck: Supple, Full range of motion, No lymphadenopathy; Cardiovascular: Regular rate and rhythm, No murmur, rub, or gallop; Respiratory: Breath sounds clear & equal bilaterally, No rales, rhonchi, wheezes.  Speaking full sentences with ease, Normal respiratory effort/excursion; Chest: Nontender, Movement normal; Abdomen: Soft, +mild diffuse tenderness to palp. No rebound or guarding. Nondistended, Normal bowel sounds; Genitourinary: No CVA tenderness; Extremities: Pulses normal, No tenderness, No edema, No calf edema or asymmetry.; Neuro: AA&Ox3, Major CN grossly intact.  Speech clear. No gross focal motor or sensory deficits in extremities.; Skin: Color normal, Warm, Dry.   ED Course  Procedures     EKG Interpretation   Date/Time:  Sunday October 29 2013 16:48:52 EDT Ventricular Rate:  76 PR Interval:  144 QRS Duration: 79 QT Interval:  399 QTC Calculation: 449 R Axis:   62 Text Interpretation:  Sinus rhythm Baseline wander When compared with ECG  of 10/07/2012 No significant change was found Confirmed by [facility]  MD,  Nicholos Johns [phone]) on 10/29/2013 5:07:31 PM      MDM  MDM Reviewed: previous chart, nursing note and  vitals Reviewed previous: labs and ECG Interpretation: labs, x-ray, CT scan and ECG    Results for orders placed during the hospital encounter of 10/29/13  CBC WITH DIFFERENTIAL      Result Value Ref Range   WBC 5.9  4.0 - 10.5 K/uL   RBC 4.58  4.22 - 5.81 MIL/uL   Hemoglobin 14.0  13.0 - 17.0 g/dL   HCT 19.1  47.8 - 29.5 %   MCV 92.1  78.0 - 100.0 fL   MCH 30.6  26.0 - 34.0 pg   MCHC 33.2  30.0 - 36.0 g/dL   RDW 62.1 (*) 30.8 - 65.7 %   Platelets 67 (*) 150 - 400 K/uL   Neutrophils Relative % 63  43 - 77 %   Neutro Abs 3.7  1.7 - 7.7 K/uL   Lymphocytes Relative 23  12 - 46 %   Lymphs Abs 1.3  0.7 - 4.0 K/uL   Monocytes Relative  14 (*) 3 - 12 %   Monocytes Absolute 0.8  0.1 - 1.0 K/uL   Eosinophils Relative 1  0 - 5 %   Eosinophils Absolute 0.0  0.0 - 0.7 K/uL   Basophils Relative 0  0 - 1 %   Basophils Absolute 0.0  0.0 - 0.1 K/uL   Smear Review SPECIMEN CHECKED FOR CLOTS    COMPREHENSIVE METABOLIC PANEL      Result Value Ref Range   Sodium 136 (*) 137 - 147 mEq/L   Potassium 3.8  3.7 - 5.3 mEq/L   Chloride 87 (*) 96 - 112 mEq/L   CO2 28  19 - 32 mEq/L   Glucose, Bld 94  70 - 99 mg/dL   BUN 12  6 - 23 mg/dL   Creatinine, Ser 1.61  0.50 - 1.35 mg/dL   Calcium 9.5  8.4 - 09.6 mg/dL   Total Protein 7.8  6.0 - 8.3 g/dL   Albumin 4.3  3.5 - 5.2 g/dL   AST 63 (*) 0 - 37 U/L   ALT 33  0 - 53 U/L   Alkaline Phosphatase 120 (*) 39 - 117 U/L   Total Bilirubin 1.5 (*) 0.3 - 1.2 mg/dL   GFR calc non Af Amer >90  >90 mL/min   GFR calc Af Amer >90  >90 mL/min   Anion gap 21 (*) 5 - 15  LIPASE, BLOOD      Result Value Ref Range   Lipase 37  11 - 59 U/L  LACTIC ACID, PLASMA      Result Value Ref Range   Lactic Acid, Venous 1.4  0.5 - 2.2 mmol/L  TROPONIN I      Result Value Ref Range   Troponin I <0.30  <0.30 ng/mL   Dg Chest 2 View 10/29/2013   CLINICAL DATA:  abd pain  EXAM: CHEST - 2 VIEW  COMPARISON:  CT from the same day, and earlier studies  FINDINGS: Lungs are  hyperinflated, clear. Heart size and mediastinal contours are within normal limits. No effusion. Mild spondylitic changes at several levels in the mid thoracic spine.  IMPRESSION: No acute cardiopulmonary disease.   Electronically Signed   By: Oley Balm M.D.   On: 10/29/2013 19:23   Ct Abdomen Pelvis W Contrast 10/29/2013   CLINICAL DATA:  48 year old male with abdominal and pelvic pain, vomiting and diarrhea.  EXAM: CT ABDOMEN AND PELVIS WITH CONTRAST  TECHNIQUE: Multidetector CT imaging of the abdomen and pelvis was performed using the standard protocol following bolus administration of intravenous contrast.  CONTRAST:  OMNIPAQUE IOHEXOL 300 MG/ML  SOLN  COMPARISON:  01/20/2013 CT  FINDINGS: Moderate to severe diffuse fatty infiltration of the liver is noted.  No focal hepatic abnormalities are noted.  The spleen, adrenal glands, pancreas, and kidneys are unremarkable.  The patient is status post cholecystectomy.  There is no evidence of free fluid, enlarged lymph nodes, biliary dilation or abdominal aortic aneurysm.  The bowel, bladder and appendix are unremarkable. There is no evidence of bowel obstruction, abscess or pneumoperitoneum.  No acute or suspicious bony abnormalities are noted. Mild to moderate degenerative disc disease at L5-S1 noted.  IMPRESSION: No evidence of acute abnormality.  Moderate to severe fatty infiltration of the liver.   Electronically Signed   By: Laveda Abbe M.D.   On: 10/29/2013 19:11    Results for CLEM, WISENBAKER (MRN [id number]) as of 10/29/2013 21:11  Ref. Range 06/24/2013 11:41 06/25/2013 05:25 10/29/2013 15:58  AST Latest Range: 0-37 U/L 203 (H) 144 (H) 63 (H)  ALT Latest Range: 0-53 U/L 49 38 33  Total Bilirubin Latest Range: 0.3-1.2 mg/dL 3.2 (H) 1.9 (H) 1.5 (H)     2110:  Workup reassuring. LFT's and Tbili per baseline. Pt has tol PO well while in the ED without N/V.  No stooling while in the ED.  Abd benign, VSS. Has ambulated with steady gait. Feels  better and wants to go home now. Dx and testing d/w pt and family.  Questions answered.  Verb understanding, agreeable to d/c home with outpt f/u.     Samuel Jester, DO 10/31/13 1549

## 2013-10-29 NOTE — ED Notes (Addendum)
Pt c/o vomiting "yellow acid," and diarrhea for q 3 days. Pt also c/o chest discomfort and abdominal pain after vomiting.Denies any present in triage. Pt states he has not eaten or drank anything for three days.

## 2014-03-22 ENCOUNTER — Emergency Department (HOSPITAL_COMMUNITY)
Admission: EM | Admit: 2014-03-22 | Discharge: 2014-03-22 | Disposition: A | Discharge status: Home / Self Care | Payer: Self-pay | Attending: Emergency Medicine | Admitting: Emergency Medicine

## 2014-03-22 ENCOUNTER — Emergency Department (HOSPITAL_COMMUNITY): Payer: Self-pay

## 2014-03-22 ENCOUNTER — Encounter (HOSPITAL_COMMUNITY): Payer: Self-pay | Admitting: *Deleted

## 2014-03-22 DIAGNOSIS — R0789 Other chest pain: Secondary | ICD-10-CM

## 2014-03-22 DIAGNOSIS — Y9289 Other specified places as the place of occurrence of the external cause: Secondary | ICD-10-CM | POA: Insufficient documentation

## 2014-03-22 DIAGNOSIS — Z79899 Other long term (current) drug therapy: Secondary | ICD-10-CM | POA: Insufficient documentation

## 2014-03-22 DIAGNOSIS — Z7952 Long term (current) use of systemic steroids: Secondary | ICD-10-CM | POA: Insufficient documentation

## 2014-03-22 DIAGNOSIS — F419 Anxiety disorder, unspecified: Secondary | ICD-10-CM | POA: Insufficient documentation

## 2014-03-22 DIAGNOSIS — Y998 Other external cause status: Secondary | ICD-10-CM | POA: Insufficient documentation

## 2014-03-22 DIAGNOSIS — Z720 Tobacco use: Secondary | ICD-10-CM | POA: Insufficient documentation

## 2014-03-22 DIAGNOSIS — S060X1A Concussion with loss of consciousness of 30 minutes or less, initial encounter: Secondary | ICD-10-CM | POA: Insufficient documentation

## 2014-03-22 DIAGNOSIS — F0781 Postconcussional syndrome: Secondary | ICD-10-CM

## 2014-03-22 DIAGNOSIS — W108XXA Fall (on) (from) other stairs and steps, initial encounter: Secondary | ICD-10-CM | POA: Insufficient documentation

## 2014-03-22 DIAGNOSIS — Y9389 Activity, other specified: Secondary | ICD-10-CM | POA: Insufficient documentation

## 2014-03-22 DIAGNOSIS — S299XXA Unspecified injury of thorax, initial encounter: Secondary | ICD-10-CM | POA: Insufficient documentation

## 2014-03-22 DIAGNOSIS — Z8719 Personal history of other diseases of the digestive system: Secondary | ICD-10-CM | POA: Insufficient documentation

## 2014-03-22 DIAGNOSIS — E876 Hypokalemia: Secondary | ICD-10-CM | POA: Insufficient documentation

## 2014-03-22 DIAGNOSIS — Z8701 Personal history of pneumonia (recurrent): Secondary | ICD-10-CM | POA: Insufficient documentation

## 2014-03-22 DIAGNOSIS — J449 Chronic obstructive pulmonary disease, unspecified: Secondary | ICD-10-CM | POA: Insufficient documentation

## 2014-03-22 DIAGNOSIS — S199XXA Unspecified injury of neck, initial encounter: Secondary | ICD-10-CM | POA: Insufficient documentation

## 2014-03-22 LAB — BASIC METABOLIC PANEL
Anion gap: 7 (ref 5–15)
BUN: 7 mg/dL (ref 6–23)
CHLORIDE: 101 meq/L (ref 96–112)
CO2: 30 mmol/L (ref 19–32)
Calcium: 8.7 mg/dL (ref 8.4–10.5)
Creatinine, Ser: 0.74 mg/dL (ref 0.50–1.35)
GFR calc Af Amer: >90 mL/min (ref >90)
Glucose, Bld: 109 mg/dL — ABNORMAL HIGH (ref 70–99)
Potassium: 3 mmol/L — ABNORMAL LOW (ref 3.5–5.1)
Sodium: 138 mmol/L (ref 135–145)

## 2014-03-22 LAB — RAPID URINE DRUG SCREEN, HOSP PERFORMED
Amphetamines: NOT DETECTED
Barbiturates: NOT DETECTED
Benzodiazepines: POSITIVE — AB
COCAINE: NOT DETECTED
OPIATES: NOT DETECTED
Tetrahydrocannabinol: NOT DETECTED

## 2014-03-22 LAB — CBC
HCT: 37.7 % — ABNORMAL LOW (ref 39.0–52.0)
HEMOGLOBIN: 12.4 g/dL — AB (ref 13.0–17.0)
MCH: 30.6 pg (ref 26.0–34.0)
MCHC: 32.9 g/dL (ref 30.0–36.0)
MCV: 93.1 fL (ref 78.0–100.0)
Platelets: 83 10*3/uL — ABNORMAL LOW (ref 150–400)
RBC: 4.05 MIL/uL — AB (ref 4.22–5.81)
RDW: 14.7 % (ref 11.5–15.5)
WBC: 4.9 10*3/uL (ref 4.0–10.5)

## 2014-03-22 LAB — ETHANOL

## 2014-03-22 MED ORDER — ONDANSETRON HCL 4 MG PO TABS: 4.0000 mg | ORAL_TABLET | Freq: Three times a day (TID) | ORAL | Status: DC | PRN

## 2014-03-22 MED ORDER — LORAZEPAM 1 MG PO TABS: 1.0000 mg | ORAL_TABLET | Freq: Three times a day (TID) | ORAL | Status: DC | PRN

## 2014-03-22 MED ORDER — POTASSIUM CHLORIDE CRYS ER 20 MEQ PO TBCR
40.0000 meq | EXTENDED_RELEASE_TABLET | Freq: Once | ORAL | Status: AC
  Administered 2014-03-22: 40 meq via ORAL
  Filled 2014-03-22: qty 2

## 2014-03-22 MED ORDER — BACITRACIN ZINC 500 UNIT/GM EX OINT
TOPICAL_OINTMENT | CUTANEOUS | Status: AC
  Filled 2014-03-22: qty 1.8

## 2014-03-22 NOTE — ED Notes (Signed)
Pt states that he fell down a flight of steps after he slipped, and then fell again [facility] reported that pt has been hearing and seeing things, c/o HA and left rib pain; abrasion noted to left elbow and to forehead, c/o right knee pain as well

## 2014-03-22 NOTE — ED Provider Notes (Signed)
CSN: [id number]     Arrival date & time 03/22/14  1111 History  This chart was scribed for Stanley Gaskins, MD by Stanley Thompson, ED Scribe. This patient was seen in room APA16A/APA16A and the patient's care was started at 12:22 PM.    Chief Complaint  Patient presents with  . Head Injury   Patient is a 49 y.o. male presenting with head injury. The history is provided by the patient. No language interpreter was used.  Head Injury Location:  Frontal and occipital Time since incident:  4 days Mechanism of injury: fall   Pain details:    Quality:  Aching   Severity:  Mild   Duration:  4 days   Timing:  Constant   Progression:  Unchanged Chronicity:  New Relieved by:  Nothing Worsened by:  Nothing tried Ineffective treatments:  None tried Associated symptoms: headache, loss of consciousness, nausea and neck pain   Associated symptoms comment:  Visual disturbance   HPI Comments: Stanley Thompson is a 49 y.o. male who presents to the Emergency Department complaining of falling and striking his head twice 4 days ago. He slipped and fell down the stairs to his basement and struck the back of his head against concrete; soon after he states he was putting a piece of wood in his stove when he fell forward and struck his forehead on the stove. He states he has had headache, visual disturbance, nausea, slight neck pain, chronic back pain, pain to upper left chest and ribs from falling, and sleep disturbance. He states that he sees black dots when he blinks or moves his head. He states he has had some suicidal ideation since the fall because of the difficulty sleeping and otherwise not feeling well, though he denies self-injury or plan. He denies prior self-injury. He denies recent drug use. He states his tetanus shot is up to date. He no longer uses blood thinners. He denies syncope. He reports none of these symptoms were present prior to falls.   He has no access to gun at home  Past Medical  History  Diagnosis Date  . Asthma   . Pneumonia   . Bronchitis   . COPD (chronic obstructive pulmonary disease)   . Shortness of breath   . Alcoholic pancreatitis   . Fatty liver    Past Surgical History  Procedure Laterality Date  . Back surgery  1984    [facility]  . Orif mandibular fracture  2004    [facility]  . Cholecystectomy N/A 10/07/2012    Procedure: ATTEMPTED LAPAROSCOPIC CHOLECYSTECTOMY;  Surgeon: Stanley Hatcher, MD;  Location: [facility];  Service: General;  Laterality: N/A;  decision to open at 0843; converted to open start at 0852  . Cholecystectomy N/A 10/07/2012    Procedure: CHOLECYSTECTOMY;  Surgeon: Stanley Hatcher, MD;  Location: [facility];  Service: General;  Laterality: N/A;  converted to open; start at [phone]  . Cholecystectomy     Family History  Problem Relation Age of Onset  . Hypertension Mother   . Cancer Mother   . Hypertension Father   . Heart failure Father   . Cancer Father    History  Substance Use Topics  . Smoking status: Current Every Day Smoker -- 1.00 packs/day for 32 years    Types: Cigarettes    Last Attempt to Quit: 07/19/2012  . Smokeless tobacco: Former Neurosurgeon     Comment: 4 packs of cigarettes per week   .  Alcohol Use: No     Comment: former drinker, last drink was 03/21/14    Review of Systems  Eyes: Positive for visual disturbance.  Cardiovascular: Positive for chest pain.  Gastrointestinal: Positive for nausea.  Musculoskeletal: Positive for back pain and neck pain.  Neurological: Positive for loss of consciousness and headaches.  Psychiatric/Behavioral: Positive for suicidal ideas and sleep disturbance. Negative for self-injury.  All other systems reviewed and are negative.     Allergies  Advair diskus  Home Medications   Prior to Admission medications   Medication Sig Start Date End Date Taking? Authorizing Provider  albuterol (PROVENTIL HFA;VENTOLIN HFA) 108 (90 BASE) MCG/ACT inhaler Inhale 1-2  puffs into the lungs every 6 (six) hours as needed for wheezing or shortness of breath.   Yes Historical Provider, MD  albuterol-ipratropium (COMBIVENT) 18-103 MCG/ACT inhaler Inhale 2 puffs into the lungs every 6 (six) hours as needed for wheezing or shortness of breath.   Yes Historical Provider, MD  B Complex Vitamins (VITAMIN B COMPLEX PO) Take 1 tablet by mouth daily.   Yes Historical Provider, MD  fluticasone (FLOVENT HFA) 44 MCG/ACT inhaler Inhale 2 puffs into the lungs 3 (three) times daily as needed (Shortness of Breath).    Yes Historical Provider, MD  potassium phosphate, monobasic, (K-PHOS ORIGINAL) 500 MG tablet Take 500 mg by mouth 2 (two) times a week.    Yes Historical Provider, MD  ondansetron (ZOFRAN) 4 MG tablet Take 1 tablet (4 mg total) by mouth every 8 (eight) hours as needed for nausea or vomiting. Patient not taking: Reported on 03/22/2014 10/29/13   Stanley Jester, DO   BP 143/80 mmHg  Pulse 121  Temp(Src) 98.6 F (37 C) (Oral)  Ht  (1.93 m)  Wt 240 lb (108.863 kg)  BMI 29.23 kg/m2  SpO2 96% Physical Exam  Nursing note and vitals reviewed.  CONSTITUTIONAL: Well developed/well nourished HEAD: Normocephalic/atraumatic, small abrasion to forehead EYES: EOMI/PERRL ENMT: Mucous membranes moist, No evidence of facial/nasal trauma  NECK: supple no meningeal signs SPINE/BACK:mild thoracic tenderness, no cervical spine tenderness, no lumbar tenderness, No bruising/crepitance/stepoffs noted to spine CV: S1/S2 noted, no murmurs/rubs/gallops noted CHEST: tenderness to left upper chest, no bruising/crepitus LUNGS: Lungs are clear to auscultation bilaterally, no apparent distress ABDOMEN: soft, nontender, no rebound or guarding, bowel sounds noted throughout abdomen GU:no cva tenderness NEURO: Pt is awake/alert/appropriate, moves all extremitiesx4.  No facial droop.  No arm/leg drift No pastpointing.  No ataxia on my evaluation EXTREMITIES: pulses normal/equal, full  ROMm healing abrasion to left elbow SKIN: warm, color normal PSYCH: no abnormalities of mood noted, alert and oriented to situation  ED Course  Procedures   DIAGNOSTIC STUDIES: Oxygen Saturation is 96% on room air, adequate by my interpretation.    COORDINATION OF CARE: 12:30 PM Discussed treatment plan with patient at beside, the patient agrees with the plan and has no further questions at this time.   Pt monitored in ED without any deterioration No distress noted No acute injury noted He can ambulate unassisted For his head injury - suspect most of his symptoms are from recent concussion given headaches/visual disturbances.  He has no focal weakness and is awake/alert GCS 15 For his reported SI - he now denies SI and denies access to weapons - he has been seen by behavioral health via telepsych and cleared for outpatient management On reassessment, he admits to drinking the night of his fall.  He reports he only drinks "twice a month"but has ETOH  pancreatitis listed also has thrombocytopenia so I suspect he is minimizing his ETOH use.  He also failed to mention on initial assessment that he took a klonopin from his significant other.  He appears mildly tremulous and I suspect he may have some possible ETOH withdrawal He is also anxious - will prescribe short course of ativan.   We discussed return precautions Wound care by nursing  Labs Review Labs Reviewed  CBC - Abnormal; Notable for the following:    RBC 4.05 (*)    Hemoglobin 12.4 (*)    HCT 37.7 (*)    Platelets 83 (*)    All other components within normal limits  BASIC METABOLIC PANEL - Abnormal; Notable for the following:    Potassium 3.0 (*)    Glucose, Bld 109 (*)    All other components within normal limits  URINE RAPID DRUG SCREEN (HOSP PERFORMED) - Abnormal; Notable for the following:    Benzodiazepines POSITIVE (*)    All other components within normal limits  ETHANOL    Imaging Review Dg Chest 2  View  03/22/2014   CLINICAL DATA:  Larey SeatFell down a flight of stairs 4 days ago striking back of head on concrete, got up and fell forward hitting head on wood stove, with chest pain and shortness of breath, LEFT anterolateral rib pain, history COPD, smoking, asthma  EXAM: CHEST  2 VIEW  COMPARISON:  10/29/2013  FINDINGS: Normal heart size, mediastinal contours, and pulmonary vascularity.  Lungs appear mildly hyperlucent but clear.  No infiltrate, pleural effusion or pneumothorax.  No definite fractures identified.  IMPRESSION: No acute abnormalities.   Electronically Signed   By: Ulyses SouthwardMark  Boles M.D.   On: 03/22/2014 13:02   Ct Head Wo Contrast  03/22/2014   CLINICAL DATA:  Patient fell down flight of steps. Headache post trauma  EXAM: CT HEAD WITHOUT CONTRAST  TECHNIQUE: Contiguous axial images were obtained from the base of the skull through the vertex without intravenous contrast.  COMPARISON:  November 20, 2011  FINDINGS: The ventricles are normal in size and configuration. There is no intracranial mass, hemorrhage, extra-axial fluid collection, or midline shift. Gray-white compartments appear normal. No acute infarct apparent. The bony calvarium appears intact. The mastoid air cells are clear. There is extensive ethmoid sinus disease bilaterally. There are small air-fluid levels in each maxillary antrum.  IMPRESSION: Paranasal sinus disease. Note that there are small air-fluid levels in each maxillary antrum. No intracranial mass, hemorrhage, or extra-axial fluid. No focal gray-white compartment lesion/acute appearing infarct.   Electronically Signed   By: Bretta BangWilliam  Woodruff M.D.   On: 03/22/2014 12:33     EKG Interpretation   Date/Time:  Thursday March 22 2014 11:48:44 EST Ventricular Rate:  99 PR Interval:  154 QRS Duration: 77 QT Interval:  338 QTC Calculation: 434 R Axis:   48 Text Interpretation:  Sinus rhythm Baseline wander in lead(s) V3  Non-specific ST-t changes No significant change since  last tracing  Confirmed by Bebe ShaggyWICKLINE  MD, Dorinda HillNALD ([id number]) on 03/22/2014 1:01:37 PM     Medications  LORazepam (ATIVAN) tablet 1 mg (not administered)  ondansetron (ZOFRAN) tablet 4 mg (not administered)  bacitracin 500 UNIT/GM ointment (not administered)  potassium chloride SA (K-DUR,KLOR-CON) CR tablet 40 mEq (40 mEq Oral Given 03/22/14 1404)     MDM   Final diagnoses:  Chest wall pain  Concussion, with loss of consciousness of 30 minutes or less, initial encounter  Post concussion syndrome  Hypokalemia  Anxiety  Nursing notes including past medical history and social history reviewed and considered in documentation xrays/imaging reviewed by myself and considered during evaluation Labs/vital reviewed myself and considered during evaluation    I personally performed the services described in this documentation, which was scribed in my presence. The recorded information has been reviewed and is accurate.      Stanley Gaskins, MD 03/22/14 [phone]

## 2014-03-22 NOTE — BH Assessment (Addendum)
Tele Assessment Note   Stanley Thompson is an 49 y.o. male. The Pt arrived at [facility] ED for treatment for a fall. The Pt reports past SI but no present SI. Pt reports that he had SI due to the effects of his fall. Pt denies HI. Pt denies delusions and hallucinations. Pt states that he fell down a flight of stairs and he hit his head on the stove four days ago. According to the Pt, after the fall the Pt began to have visual hallucinations, mood changes, and difficulty sleeping. Pt states that he has only slept 3 hours since Sunday. Pt reports that he has been "snappy" with his family and he has never been snappy. Pt reports auditory and visual hallucinations. Pt states that he began to think about harming himself when he thought about living with his current symptoms. Pt then states that he does not want  to harm himself he only wants someone to help him manage the effects of his fall. Pt reports drinking 2 to 3xs a month.   Writer consulted with NP Stanley Thompson. Per Stanley Thompson Pt does not meet inpatient criteria, Pt should follow up with PCP, and for EDP to provide Pt with information about a concussion.  Axis I: Adjustment Disorder NOS Axis II: Deferred Axis III:  Past Medical History  Diagnosis Date  . Asthma   . Pneumonia   . Bronchitis   . COPD (chronic obstructive pulmonary disease)   . Shortness of breath   . Alcoholic pancreatitis   . Fatty liver    Axis IV: problems related to social environment Axis V: 61-70 mild symptoms  Past Medical History:  Past Medical History  Diagnosis Date  . Asthma   . Pneumonia   . Bronchitis   . COPD (chronic obstructive pulmonary disease)   . Shortness of breath   . Alcoholic pancreatitis   . Fatty liver     Past Surgical History  Procedure Laterality Date  . Back surgery  1984    [name]  . Orif mandibular fracture  2004    [facility]  . Cholecystectomy N/A 10/07/2012    Procedure: ATTEMPTED LAPAROSCOPIC CHOLECYSTECTOMY;  Surgeon:  Marlane Hatcher, MD;  Location: [facility];  Service: General;  Laterality: N/A;  decision to open at 0843; converted to open start at 0852  . Cholecystectomy N/A 10/07/2012    Procedure: CHOLECYSTECTOMY;  Surgeon: Marlane Hatcher, MD;  Location: [facility];  Service: General;  Laterality: N/A;  converted to open; start at [phone]  . Cholecystectomy      Family History:  Family History  Problem Relation Age of Onset  . Hypertension Mother   . Cancer Mother   . Hypertension Father   . Heart failure Father   . Cancer Father     Social History:  reports that he has been smoking Cigarettes.  He has a 32 pack-year smoking history. He has quit using smokeless tobacco. He reports that he does not drink alcohol or use illicit drugs.  Additional Social History:  Alcohol / Drug Use Pain Medications: Pt denies  Prescriptions: Pt denies Over the Counter: Pt denies History of alcohol / drug use?: No history of alcohol / drug abuse Longest period of sobriety (when/how long): NA  CIWA: CIWA-Ar BP: 137/81 mmHg Pulse Rate: 89 COWS:    PATIENT STRENGTHS: (choose at least two) Communication skills Work skills  Allergies:  Allergies  Allergen Reactions  . Advair Diskus [Fluticasone-Salmeterol] Anaphylaxis  Home Medications:  (Not in a hospital admission)  OB/GYN Status:  No LMP for male patient.  General Assessment Data Location of Assessment: [facility] ACT Assessment: Yes Is this a Tele or Face-to-Face Assessment?: Tele Assessment Is this an Initial Assessment or a Re-assessment for this encounter?: Initial Assessment Living Arrangements: Spouse/significant other Can pt return to current living arrangement?: Yes Admission Status: Voluntary Is patient capable of signing voluntary admission?: Yes Transfer from: Home Referral Source: Self/Family/Friend     [facility] Crisis Care Plan Living Arrangements: Spouse/significant other Name of Psychiatrist: None Name of Therapist: None  Education  Status Is patient currently in school?: No Current Grade: NA Highest grade of school patient has completed: 12 Name of school: NA Contact person: NA  Risk to self with the past 6 months Suicidal Ideation: No-Not Currently/Within Last 6 Months Suicidal Intent: No Is patient at risk for suicide?: No Suicidal Plan?: No Access to Means: No What has been your use of drugs/alcohol within the last 12 months?: Alcohol Previous Attempts/Gestures: No How many times?: 0 Other Self Harm Risks: None Triggers for Past Attempts: None known Intentional Self Injurious Behavior: None Family Suicide History: No Recent stressful life event(s): Other (Comment) (None) Persecutory voices/beliefs?: No Depression: Yes Depression Symptoms: Insomnia, Loss of interest in usual pleasures, Feeling worthless/self pity, Feeling angry/irritable Substance abuse history and/or treatment for substance abuse?: Yes Suicide prevention information given to non-admitted patients: Not applicable  Risk to Others within the past 6 months Homicidal Ideation: No Thoughts of Harm to Others: No Current Homicidal Intent: No Current Homicidal Plan: No Access to Homicidal Means: No Identified Victim: None History of harm to others?: No Assessment of Violence: None Noted Violent Behavior Description: None Does patient have access to weapons?: No Criminal Charges Pending?: Yes Describe Pending Criminal Charges: Child support Does patient have a court date: Yes Court Date: 06/06/14  Psychosis Hallucinations: Auditory Delusions: None noted  Mental Status Report Appear/Hygiene: Unremarkable, In hospital gown Eye Contact: Fair Motor Activity: Freedom of movement Speech: Logical/coherent Level of Consciousness: Alert Mood: Depressed, Irritable Affect: Appropriate to circumstance Anxiety Level: Minimal Thought Processes: Coherent, Relevant Judgement: Impaired Orientation: Person, Place, Time, Situation Obsessive  Compulsive Thoughts/Behaviors: None  Cognitive Functioning Concentration: Poor Memory: Recent Intact, Remote Intact IQ: Average Insight: Fair Impulse Control: Fair Appetite: Good Weight Loss: 0 Weight Gain: 0 Sleep: Decreased Total Hours of Sleep: 3 Vegetative Symptoms: None  ADLScreening [facility] Assessment Services) Patient's cognitive ability adequate to safely complete daily activities?: Yes Patient able to express need for assistance with ADLs?: Yes Independently performs ADLs?: Yes (appropriate for developmental age)  Prior Inpatient Therapy Prior Inpatient Therapy: No Prior Therapy Dates: NA Prior Therapy Facilty/Provider(s): NA Reason for Treatment: NA  Prior Outpatient Therapy Prior Outpatient Therapy: No Prior Therapy Dates: NA Prior Therapy Facilty/Provider(s): NA Reason for Treatment: NA  ADL Screening (condition at time of admission) Patient's cognitive ability adequate to safely complete daily activities?: Yes Is the patient deaf or have difficulty hearing?: No Does the patient have difficulty seeing, even when wearing glasses/contacts?: Yes Does the patient have difficulty concentrating, remembering, or making decisions?: Yes Patient able to express need for assistance with ADLs?: Yes Does the patient have difficulty dressing or bathing?: No Independently performs ADLs?: Yes (appropriate for developmental age) Does the patient have difficulty walking or climbing stairs?: No       Abuse/Neglect Assessment (Assessment to be complete while patient is alone) Physical Abuse: Denies Verbal Abuse: Denies Sexual Abuse: Denies Exploitation of patient/patient's resources:  Denies Self-Neglect: Denies Values / Beliefs Cultural Requests During Hospitalization: None Spiritual Requests During Hospitalization: None   Advance Directives (For Healthcare) Does patient have an advance directive?: No Would patient like information on creating an advanced directive?: No  - patient declined information    Additional Information 1:1 In Past 12 Months?: No CIRT Risk: No Elopement Risk: No Does patient have medical clearance?: Yes     Disposition:  Disposition Initial Assessment Completed for this Encounter: Yes Disposition of Patient: Other dispositions Type of outpatient treatment: Adult Other disposition(s): Other (Comment) (Follow-up with PCP)  Tarrie Mcmichen D 03/22/2014 1:53 PM

## 2014-03-22 NOTE — ED Notes (Addendum)
Pt admits to having SI since fall but denies any plan, states that he has never has SI before in the past

## 2014-03-22 NOTE — ED Notes (Signed)
Applied bacitracin per verbal order to burn on left elbow and top of forehead. Pt tolerated well.

## 2014-03-22 NOTE — ED Notes (Signed)
telepsych monitor at bedside. 

## 2014-03-22 NOTE — ED Notes (Signed)
Pt ambulated in hall, o2 saturation 94-96%,HR 104-106. Pt reports dizziness while ambulating. EDP aware.

## 2014-03-22 NOTE — BHH Counselor (Signed)
Writer informed TTS Brandi of the consult.  

## 2014-03-22 NOTE — ED Notes (Signed)
Per Merry ProudBrandi, TTS, pt is to Follow-up with PCP and for EDP to give pt information regarding Concussion. Merry ProudBrandi, reported would notify EDP.

## 2014-11-11 ENCOUNTER — Inpatient Hospital Stay (HOSPITAL_COMMUNITY)
Admission: EM | Admit: 2014-11-11 | Discharge: 2014-11-15 | DRG: 897 | Disposition: A | Discharge status: Home / Self Care | Payer: Self-pay | Attending: Internal Medicine | Admitting: Internal Medicine

## 2014-11-11 ENCOUNTER — Encounter (HOSPITAL_COMMUNITY): Payer: Self-pay | Admitting: Emergency Medicine

## 2014-11-11 DIAGNOSIS — F17210 Nicotine dependence, cigarettes, uncomplicated: Secondary | ICD-10-CM | POA: Diagnosis present

## 2014-11-11 DIAGNOSIS — F10931 Alcohol use, unspecified with withdrawal delirium: Secondary | ICD-10-CM

## 2014-11-11 DIAGNOSIS — F10951 Alcohol use, unspecified with alcohol-induced psychotic disorder with hallucinations: Secondary | ICD-10-CM

## 2014-11-11 DIAGNOSIS — J45909 Unspecified asthma, uncomplicated: Secondary | ICD-10-CM | POA: Diagnosis present

## 2014-11-11 DIAGNOSIS — Z86711 Personal history of pulmonary embolism: Secondary | ICD-10-CM

## 2014-11-11 DIAGNOSIS — Z720 Tobacco use: Secondary | ICD-10-CM

## 2014-11-11 DIAGNOSIS — J449 Chronic obstructive pulmonary disease, unspecified: Secondary | ICD-10-CM | POA: Diagnosis present

## 2014-11-11 DIAGNOSIS — Y906 Blood alcohol level of 120-199 mg/100 ml: Secondary | ICD-10-CM | POA: Diagnosis present

## 2014-11-11 DIAGNOSIS — K760 Fatty (change of) liver, not elsewhere classified: Secondary | ICD-10-CM | POA: Diagnosis present

## 2014-11-11 DIAGNOSIS — F1010 Alcohol abuse, uncomplicated: Secondary | ICD-10-CM

## 2014-11-11 DIAGNOSIS — E876 Hypokalemia: Secondary | ICD-10-CM

## 2014-11-11 DIAGNOSIS — F1120 Opioid dependence, uncomplicated: Secondary | ICD-10-CM | POA: Diagnosis present

## 2014-11-11 DIAGNOSIS — E669 Obesity, unspecified: Secondary | ICD-10-CM | POA: Diagnosis present

## 2014-11-11 DIAGNOSIS — R740 Nonspecific elevation of levels of transaminase and lactic acid dehydrogenase [LDH]: Secondary | ICD-10-CM

## 2014-11-11 DIAGNOSIS — F10251 Alcohol dependence with alcohol-induced psychotic disorder with hallucinations: Principal | ICD-10-CM | POA: Diagnosis present

## 2014-11-11 DIAGNOSIS — Z6833 Body mass index (BMI) 33.0-33.9, adult: Secondary | ICD-10-CM

## 2014-11-11 DIAGNOSIS — R7401 Elevation of levels of liver transaminase levels: Secondary | ICD-10-CM

## 2014-11-11 DIAGNOSIS — E860 Dehydration: Secondary | ICD-10-CM | POA: Diagnosis present

## 2014-11-11 DIAGNOSIS — Z809 Family history of malignant neoplasm, unspecified: Secondary | ICD-10-CM

## 2014-11-11 DIAGNOSIS — F10231 Alcohol dependence with withdrawal delirium: Secondary | ICD-10-CM

## 2014-11-11 DIAGNOSIS — Z8249 Family history of ischemic heart disease and other diseases of the circulatory system: Secondary | ICD-10-CM

## 2014-11-11 HISTORY — DX: Other pulmonary embolism without acute cor pulmonale: I26.99

## 2014-11-11 LAB — COMPREHENSIVE METABOLIC PANEL
ALT: 69 U/L — ABNORMAL HIGH (ref 17–63)
AST: 152 U/L — ABNORMAL HIGH (ref 15–41)
Albumin: 4.4 g/dL (ref 3.5–5.0)
Alkaline Phosphatase: 89 U/L (ref 38–126)
Anion gap: 16 — ABNORMAL HIGH (ref 5–15)
BUN: 17 mg/dL (ref 6–20)
CO2: 29 mmol/L (ref 22–32)
Calcium: 8.6 mg/dL — ABNORMAL LOW (ref 8.9–10.3)
Chloride: 93 mmol/L — ABNORMAL LOW (ref 101–111)
Creatinine, Ser: 0.74 mg/dL (ref 0.61–1.24)
GFR calc non Af Amer: >60 mL/min (ref >60)
Glucose, Bld: 102 mg/dL — ABNORMAL HIGH (ref 65–99)
Potassium: 3.2 mmol/L — ABNORMAL LOW (ref 3.5–5.1)
SODIUM: 138 mmol/L (ref 135–145)
TOTAL PROTEIN: 7.6 g/dL (ref 6.5–8.1)
Total Bilirubin: 1.5 mg/dL — ABNORMAL HIGH (ref 0.3–1.2)

## 2014-11-11 LAB — CBC
HCT: 44 % (ref 39.0–52.0)
Hemoglobin: 15 g/dL (ref 13.0–17.0)
MCH: 31.3 pg (ref 26.0–34.0)
MCHC: 34.1 g/dL (ref 30.0–36.0)
MCV: 91.7 fL (ref 78.0–100.0)
PLATELETS: 100 10*3/uL — AB (ref 150–400)
RBC: 4.8 MIL/uL (ref 4.22–5.81)
RDW: 13.6 % (ref 11.5–15.5)
WBC: 8.5 10*3/uL (ref 4.0–10.5)

## 2014-11-11 LAB — RAPID URINE DRUG SCREEN, HOSP PERFORMED
AMPHETAMINES: NOT DETECTED
BARBITURATES: NOT DETECTED
BENZODIAZEPINES: NOT DETECTED
Cocaine: NOT DETECTED
Opiates: NOT DETECTED
TETRAHYDROCANNABINOL: NOT DETECTED

## 2014-11-11 LAB — MAGNESIUM: MAGNESIUM: 2 mg/dL (ref 1.7–2.4)

## 2014-11-11 LAB — ETHANOL: Alcohol, Ethyl (B): 184 mg/dL — ABNORMAL HIGH (ref <5)

## 2014-11-11 LAB — LIPASE, BLOOD: Lipase: 43 U/L (ref 22–51)

## 2014-11-11 LAB — ACETAMINOPHEN LEVEL

## 2014-11-11 LAB — SALICYLATE LEVEL: Salicylate Lvl: <4 mg/dL (ref 2.8–30.0)

## 2014-11-11 MED ORDER — LORAZEPAM 2 MG/ML IJ SOLN
0.0000 mg | Freq: Four times a day (QID) | INTRAMUSCULAR | Status: DC
  Administered 2014-11-11 (×2): 1 mg via INTRAVENOUS
  Administered 2014-11-12: 2 mg via INTRAVENOUS
  Filled 2014-11-11 (×3): qty 1

## 2014-11-11 MED ORDER — ONDANSETRON HCL 4 MG/2ML IJ SOLN
4.0000 mg | Freq: Four times a day (QID) | INTRAMUSCULAR | Status: DC | PRN
  Administered 2014-11-12 – 2014-11-15 (×2): 4 mg via INTRAVENOUS
  Filled 2014-11-11 (×2): qty 2

## 2014-11-11 MED ORDER — THIAMINE HCL 100 MG/ML IJ SOLN: 100.0000 mg | Freq: Every day | INTRAMUSCULAR | Status: DC

## 2014-11-11 MED ORDER — METHADONE HCL 10 MG PO TABS
20.0000 mg | ORAL_TABLET | Freq: Three times a day (TID) | ORAL | Status: DC
  Administered 2014-11-11 – 2014-11-15 (×10): 20 mg via ORAL
  Filled 2014-11-11 (×10): qty 2

## 2014-11-11 MED ORDER — ADULT MULTIVITAMIN W/MINERALS CH
1.0000 | ORAL_TABLET | Freq: Every day | ORAL | Status: DC
  Administered 2014-11-12 – 2014-11-15 (×4): 1 via ORAL
  Filled 2014-11-11 (×3): qty 1

## 2014-11-11 MED ORDER — ALBUTEROL SULFATE (2.5 MG/3ML) 0.083% IN NEBU
2.5000 mg | INHALATION_SOLUTION | Freq: Four times a day (QID) | RESPIRATORY_TRACT | Status: DC | PRN
  Administered 2014-11-13 – 2014-11-14 (×4): 2.5 mg via RESPIRATORY_TRACT
  Filled 2014-11-11 (×4): qty 3

## 2014-11-11 MED ORDER — ONDANSETRON HCL 4 MG PO TABS: 4.0000 mg | ORAL_TABLET | Freq: Four times a day (QID) | ORAL | Status: DC | PRN

## 2014-11-11 MED ORDER — M.V.I. ADULT IV INJ
INJECTION | Freq: Once | INTRAVENOUS | Status: AC
Start: 2014-11-11 — End: 2014-11-11
  Administered 2014-11-11: 18:00:00 via INTRAVENOUS
  Filled 2014-11-11: qty 1000

## 2014-11-11 MED ORDER — THERA M PLUS PO TABS: 1.0000 | ORAL_TABLET | Freq: Every day | ORAL | Status: DC

## 2014-11-11 MED ORDER — SODIUM CHLORIDE 0.9 % IV SOLN
INTRAVENOUS | Status: DC
  Administered 2014-11-11 – 2014-11-15 (×6): via INTRAVENOUS

## 2014-11-11 MED ORDER — FOLIC ACID 1 MG PO TABS
1.0000 mg | ORAL_TABLET | Freq: Every day | ORAL | Status: DC
  Administered 2014-11-11: 1 mg via ORAL
  Filled 2014-11-11: qty 1

## 2014-11-11 MED ORDER — M.V.I. ADULT IV INJ
INJECTION | INTRAVENOUS | Status: AC
  Filled 2014-11-11: qty 10

## 2014-11-11 MED ORDER — ADULT MULTIVITAMIN W/MINERALS CH
1.0000 | ORAL_TABLET | Freq: Every day | ORAL | Status: DC
  Administered 2014-11-11: 1 via ORAL
  Filled 2014-11-11 (×3): qty 1

## 2014-11-11 MED ORDER — FOLIC ACID 5 MG/ML IJ SOLN
INTRAMUSCULAR | Status: AC
  Filled 2014-11-11: qty 0.2

## 2014-11-11 MED ORDER — POLYETHYLENE GLYCOL 3350 17 G PO PACK: 17.0000 g | PACK | Freq: Every day | ORAL | Status: DC | PRN

## 2014-11-11 MED ORDER — DOCUSATE SODIUM 100 MG PO CAPS
100.0000 mg | ORAL_CAPSULE | Freq: Two times a day (BID) | ORAL | Status: DC
  Administered 2014-11-11 – 2014-11-15 (×7): 100 mg via ORAL
  Filled 2014-11-11 (×7): qty 1

## 2014-11-11 MED ORDER — POTASSIUM CHLORIDE CRYS ER 20 MEQ PO TBCR
40.0000 meq | EXTENDED_RELEASE_TABLET | ORAL | Status: AC
  Administered 2014-11-11 (×2): 40 meq via ORAL
  Filled 2014-11-11 (×2): qty 2

## 2014-11-11 MED ORDER — LORAZEPAM 2 MG/ML IJ SOLN
1.0000 mg | Freq: Four times a day (QID) | INTRAMUSCULAR | Status: DC | PRN
  Administered 2014-11-12: 1 mg via INTRAVENOUS
  Filled 2014-11-11: qty 1

## 2014-11-11 MED ORDER — THIAMINE HCL 100 MG/ML IJ SOLN
INTRAMUSCULAR | Status: AC
  Filled 2014-11-11: qty 2

## 2014-11-11 MED ORDER — SODIUM CHLORIDE 0.9 % IV BOLUS (SEPSIS)
2000.0000 mL | Freq: Once | INTRAVENOUS | Status: AC
  Administered 2014-11-11: 2000 mL via INTRAVENOUS

## 2014-11-11 MED ORDER — LORAZEPAM 1 MG PO TABS: 1.0000 mg | ORAL_TABLET | Freq: Four times a day (QID) | ORAL | Status: DC | PRN

## 2014-11-11 MED ORDER — SODIUM CHLORIDE 0.9 % IJ SOLN
3.0000 mL | Freq: Two times a day (BID) | INTRAMUSCULAR | Status: DC
  Administered 2014-11-12 – 2014-11-15 (×4): 3 mL via INTRAVENOUS

## 2014-11-11 MED ORDER — THIAMINE HCL 100 MG/ML IJ SOLN
100.0000 mg | Freq: Every day | INTRAMUSCULAR | Status: DC
  Filled 2014-11-11 (×2): qty 2

## 2014-11-11 MED ORDER — LORAZEPAM 2 MG/ML IJ SOLN: 1.0000 mg | Freq: Four times a day (QID) | INTRAMUSCULAR | Status: DC | PRN

## 2014-11-11 MED ORDER — ADULT MULTIVITAMIN W/MINERALS CH
1.0000 | ORAL_TABLET | Freq: Every day | ORAL | Status: DC
  Administered 2014-11-11: 1 via ORAL
  Filled 2014-11-11: qty 1

## 2014-11-11 MED ORDER — POTASSIUM CHLORIDE CRYS ER 20 MEQ PO TBCR
40.0000 meq | EXTENDED_RELEASE_TABLET | Freq: Once | ORAL | Status: AC
  Administered 2014-11-11: 40 meq via ORAL
  Filled 2014-11-11: qty 2

## 2014-11-11 MED ORDER — VITAMIN B-1 100 MG PO TABS
100.0000 mg | ORAL_TABLET | Freq: Every day | ORAL | Status: DC
  Administered 2014-11-11 – 2014-11-15 (×5): 100 mg via ORAL
  Filled 2014-11-11 (×5): qty 1

## 2014-11-11 MED ORDER — LORAZEPAM 2 MG/ML IJ SOLN: 0.0000 mg | Freq: Two times a day (BID) | INTRAMUSCULAR | Status: DC

## 2014-11-11 MED ORDER — ENOXAPARIN SODIUM 40 MG/0.4ML ~~LOC~~ SOLN
40.0000 mg | SUBCUTANEOUS | Status: DC
  Administered 2014-11-11 – 2014-11-14 (×4): 40 mg via SUBCUTANEOUS
  Filled 2014-11-11 (×4): qty 0.4

## 2014-11-11 MED ORDER — THIAMINE HCL 100 MG/ML IJ SOLN: 100.0000 mg | Freq: Once | INTRAMUSCULAR | Status: AC

## 2014-11-11 MED ORDER — FOLIC ACID 1 MG PO TABS
1.0000 mg | ORAL_TABLET | Freq: Every day | ORAL | Status: DC
  Administered 2014-11-11 – 2014-11-15 (×5): 1 mg via ORAL
  Filled 2014-11-11 (×5): qty 1

## 2014-11-11 MED ORDER — VITAMIN B-1 100 MG PO TABS
100.0000 mg | ORAL_TABLET | Freq: Every day | ORAL | Status: DC
  Administered 2014-11-11: 100 mg via ORAL
  Filled 2014-11-11: qty 1

## 2014-11-11 MED ORDER — DEXTROSE-NACL 5-0.45 % IV SOLN
INTRAVENOUS | Status: DC
  Administered 2014-11-11: 16:00:00 via INTRAVENOUS

## 2014-11-11 MED ORDER — GABAPENTIN 300 MG PO CAPS
300.0000 mg | ORAL_CAPSULE | Freq: Three times a day (TID) | ORAL | Status: DC
  Administered 2014-11-11 – 2014-11-13 (×6): 300 mg via ORAL
  Administered 2014-11-13: 600 mg via ORAL
  Administered 2014-11-14 – 2014-11-15 (×4): 300 mg via ORAL
  Filled 2014-11-11: qty 2
  Filled 2014-11-11 (×11): qty 1

## 2014-11-11 MED ORDER — METOCLOPRAMIDE HCL 5 MG/ML IJ SOLN
10.0000 mg | Freq: Once | INTRAMUSCULAR | Status: AC
  Administered 2014-11-11: 10 mg via INTRAVENOUS
  Filled 2014-11-11: qty 2

## 2014-11-11 MED ORDER — LORAZEPAM 2 MG/ML IJ SOLN
0.0000 mg | Freq: Four times a day (QID) | INTRAMUSCULAR | Status: DC
Start: 2014-11-11 — End: 2014-11-11
  Administered 2014-11-11: 2 mg via INTRAVENOUS
  Filled 2014-11-11: qty 1

## 2014-11-11 NOTE — ED Provider Notes (Addendum)
CSN: [id number]     Arrival date & time 11/11/14  1016 History   First MD Initiated Contact with Patient 11/11/14 1237     Chief Complaint  Patient presents with  . Medical Clearance   Chief complaint abdominal pain, seeing things, vomiting and diarrhea  (Consider location/radiation/quality/duration/timing/severity/associated sxs/prior Treatment) HPI Patient reports she's been on a drinking binge for the past week. He wants help with his drinking problem. He states he's been seeing things and hearing voices for the past few days. He also complains of right flank pain rating 10 epigastric for the past 2 days. He treated himself with Percocet 3 tablets 2 days ago, somebody else's methadone 20 mg this morning and 4 Tylenol PM tablets this morning. He's had multiple episodes of diarrhea over the past week. Last episode of diarrhea yesterday. No other associated symptoms. Past Medical History  Diagnosis Date  . Asthma   . Pneumonia   . Bronchitis   . COPD (chronic obstructive pulmonary disease)   . Shortness of breath   . Alcoholic pancreatitis   . Fatty liver   . Pulmonary embolus    Past Surgical History  Procedure Laterality Date  . Back surgery  1984    [facility]  . Orif mandibular fracture  2004    [facility]  . Cholecystectomy N/A 10/07/2012    Procedure: ATTEMPTED LAPAROSCOPIC CHOLECYSTECTOMY;  Surgeon: Marlane Hatcher, MD;  Location: [facility];  Service: General;  Laterality: N/A;  decision to open at 0843; converted to open start at 0852  . Cholecystectomy N/A 10/07/2012    Procedure: CHOLECYSTECTOMY;  Surgeon: Marlane Hatcher, MD;  Location: [facility];  Service: General;  Laterality: N/A;  converted to open; start at [phone]  . Cholecystectomy     Family History  Problem Relation Age of Onset  . Hypertension Mother   . Cancer Mother   . Hypertension Father   . Heart failure Father   . Cancer Father    Social History  Substance Use Topics  . Smoking status:  Current Every Day Smoker -- 1.00 packs/day for 32 years    Types: Cigarettes  . Smokeless tobacco: Former Neurosurgeon  . Alcohol Use: Yes     Comment: 1 liter of liqour a day.    Review of Systems  Constitutional: Negative.   HENT: Negative.   Respiratory: Negative.   Cardiovascular: Negative.   Gastrointestinal: Positive for vomiting, abdominal pain and diarrhea.  Musculoskeletal: Negative.   Skin: Negative.   Allergic/Immunologic: Positive for immunocompromised state.       Alcoholic  Neurological: Negative.   Psychiatric/Behavioral: Positive for hallucinations.  All other systems reviewed and are negative.     Allergies  Advair diskus  Home Medications   Prior to Admission medications   Medication Sig Start Date End Date Taking? Authorizing Provider  albuterol (PROVENTIL HFA;VENTOLIN HFA) 108 (90 BASE) MCG/ACT inhaler Inhale 1-2 puffs into the lungs every 6 (six) hours as needed for wheezing or shortness of breath.   Yes Historical Provider, MD  albuterol (PROVENTIL) (2.5 MG/3ML) 0.083% nebulizer solution Take 2.5 mg by nebulization every 6 (six) hours as needed for wheezing or shortness of breath.   Yes Historical Provider, MD  albuterol-ipratropium (COMBIVENT) 18-103 MCG/ACT inhaler Inhale 2 puffs into the lungs every 6 (six) hours as needed for wheezing or shortness of breath.   Yes Historical Provider, MD  fluticasone (FLOVENT HFA) 44 MCG/ACT inhaler Inhale 2 puffs into the lungs 3 (three) times daily  as needed (Shortness of Breath).    Yes Historical Provider, MD  gabapentin (NEURONTIN) 300 MG capsule Take 300-900 mg by mouth 3 (three) times daily. 1 in the morning, 1 midday, and 3 at night   Yes Historical Provider, MD  methadone (DOLOPHINE) 10 MG tablet Take 20 mg by mouth every 8 (eight) hours.   Yes Historical Provider, MD  Multiple Vitamins-Minerals (MULTIVITAMINS THER. W/MINERALS) TABS tablet Take 1 tablet by mouth daily.   Yes Historical Provider, MD   oxyCODONE-acetaminophen (PERCOCET) 10-325 MG per tablet Take 1 tablet by mouth every 4 (four) hours as needed for pain.   Yes Historical Provider, MD  potassium phosphate, monobasic, (K-PHOS ORIGINAL) 500 MG tablet Take 500 mg by mouth 2 (two) times a week.    Yes Historical Provider, MD   BP 131/85 mmHg  Pulse 103  Temp(Src) 98.3 F (36.8 C) (Oral)  Resp 18  Ht 6\' 3"  (1.905 m)  Wt 268 lb (121.564 kg)  BMI 33.50 kg/m2  SpO2 98% Physical Exam  Constitutional: He appears well-developed and well-nourished.  HENT:  Head: Normocephalic and atraumatic.  Mucous membranes dry  Eyes: Conjunctivae are normal. Pupils are equal, round, and reactive to light.  Neck: Neck supple. No tracheal deviation present. No thyromegaly present.  Cardiovascular: Normal rate and regular rhythm.   No murmur heard. Pulmonary/Chest: Effort normal and breath sounds normal.  Abdominal: Soft. Bowel sounds are normal. He exhibits no distension. There is no tenderness.  Genitourinary: Penis normal.  Musculoskeletal: Normal range of motion. He exhibits no edema or tenderness.  Neurological: He is alert. Coordination normal.  Skin: Skin is warm and dry. No rash noted.  Psychiatric: He has a normal mood and affect. His behavior is normal.  Nursing note and vitals reviewed.   ED Course  Procedures (including critical care time) Labs Review Labs Reviewed  COMPREHENSIVE METABOLIC PANEL - Abnormal; Notable for the following:    Potassium 3.2 (*)    Chloride 93 (*)    Glucose, Bld 102 (*)    Calcium 8.6 (*)    AST 152 (*)    ALT 69 (*)    Total Bilirubin 1.5 (*)    Anion gap 16 (*)    All other components within normal limits  ETHANOL - Abnormal; Notable for the following:    Alcohol, Ethyl (B) 184 (*)    All other components within normal limits  CBC - Abnormal; Notable for the following:    Platelets 100 (*)    All other components within normal limits  URINE RAPID DRUG SCREEN, HOSP PERFORMED     Imaging Review No results found. I have personally reviewed and evaluated these images and lab results as part of my medical decision-making.   EKG Interpretation None     1:44 PM feels improved after treatment with intravenous fluids and intravenous Reglan. Alert Glasgow Coma Score 15. Results for orders placed or performed during the hospital encounter of 11/11/14  Comprehensive metabolic panel  Result Value Ref Range   Sodium 138 135 - 145 mmol/L   Potassium 3.2 (L) 3.5 - 5.1 mmol/L   Chloride 93 (L) 101 - 111 mmol/L   CO2 29 22 - 32 mmol/L   Glucose, Bld 102 (H) 65 - 99 mg/dL   BUN 17 6 - 20 mg/dL   Creatinine, Ser 2.13 0.61 - 1.24 mg/dL   Calcium 8.6 (L) 8.9 - 10.3 mg/dL   Total Protein 7.6 6.5 - 8.1 g/dL   Albumin 4.4 3.5 -  5.0 g/dL   AST 161 (H) 15 - 41 U/L   ALT 69 (H) 17 - 63 U/L   Alkaline Phosphatase 89 38 - 126 U/L   Total Bilirubin 1.5 (H) 0.3 - 1.2 mg/dL   GFR calc non Af Amer >60 >60 mL/min   GFR calc Af Amer >60 >60 mL/min   Anion gap 16 (H) 5 - 15  Ethanol (ETOH)  Result Value Ref Range   Alcohol, Ethyl (B) 184 (H) <5 mg/dL  CBC  Result Value Ref Range   WBC 8.5 4.0 - 10.5 K/uL   RBC 4.80 4.22 - 5.81 MIL/uL   Hemoglobin 15.0 13.0 - 17.0 g/dL   HCT 09.6 04.5 - 40.9 %   MCV 91.7 78.0 - 100.0 fL   MCH 31.3 26.0 - 34.0 pg   MCHC 34.1 30.0 - 36.0 g/dL   RDW 81.1 91.4 - 78.2 %   Platelets 100 (L) 150 - 400 K/uL  Acetaminophen level  Result Value Ref Range   Acetaminophen (Tylenol), Serum <10 (L) 10 - 30 ug/mL  Salicylate level  Result Value Ref Range   Salicylate Lvl <4.0 2.8 - 30.0 mg/dL  Lipase, blood  Result Value Ref Range   Lipase 43 22 - 51 U/L   No results found.  MDM  Spoke with Dr. Ardyth Harps plan admit to telemetry patient should be observed for alcohol withdrawal,, IV hydration. He is taking other peoples narcotic medication, may need treatment and observation for opiate abuse and withdrawal Diagnosis #1 alcoholic hallucinosis #2  chronic alcohol abuse #3 elevated LFTs #4 polysubstance abuse 5 hypokalemia #6 diarrhea #7 dehydration Final diagnoses:  None        Doug Sou, MD 11/11/14 1358  Doug Sou, MD 11/11/14 1359  Doug Sou, MD 11/11/14 1420

## 2014-11-11 NOTE — ED Notes (Signed)
Attempted report. Nurse to call back.

## 2014-11-11 NOTE — ED Notes (Addendum)
Pt reports wants to detox from alcohol. Pt reports has been drinking daily for last nine days. Pt denies SI but reports hallucinations and hearing voices. Pt denies wanting to harm self or others. nad noted. Pt reports n/v/d. Pt reports last drink this am. Pt reports abdominal pain and "pancreatitis flare-up." pt reports also took  percocet x2 days ago,  of methadone this am.pt reports took 4 tylenol pm this am as well.

## 2014-11-11 NOTE — H&P (Signed)
[facility]          History and Physical    PCP:  [facility] health Department  EDP: Hoover Brunette, M.D.  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 He states that for the past 9 days he has been binge drinking and is drinking a liter to a liter and a half of vodka, every day. Over the past 2 days he has been seeing things on the wall, describes little black dots moving up and down the wall. Shows me the smoke detector in his room and tells me that it has a big mouth with big teeth that is opening and closing. He has also been having some vomiting. He came to the emergency department because he wants assistance with alcohol cessation. in the ED he was found to be hypokalemic, with transaminitis. Alcohol level was 184. We are asked to admit him for further evaluation and management.   Allergies:   Allergies  Allergen Reactions  . Advair Diskus [Fluticasone-Salmeterol] Anaphylaxis      Past Medical History  Diagnosis Date  . Asthma   . Pneumonia   . Bronchitis   . COPD (chronic obstructive pulmonary disease)   . Shortness of breath   . Alcoholic pancreatitis   . Fatty liver   . Pulmonary embolus     Past Surgical History  Procedure Laterality Date  . Back surgery  [place]  . Orif mandibular fracture  2004    [facility]  . Cholecystectomy N/A 10/07/2012    Procedure: ATTEMPTED LAPAROSCOPIC CHOLECYSTECTOMY;  Surgeon: Scherry Ran, MD;  Location: [facility];  Service: General;  Laterality: N/A;  decision to open at 0843; converted to open start at 0852  . Cholecystectomy N/A 10/07/2012    Procedure: CHOLECYSTECTOMY;  Surgeon: Scherry Ran, MD;  Location: [facility];  Service: General;  Laterality:  N/A;  converted to open; start at [id number]  . Cholecystectomy      Prior to Admission medications   Medication Sig Start Date End Date Taking? Authorizing Provider  albuterol (PROVENTIL HFA;VENTOLIN HFA) 108 (90 BASE) MCG/ACT inhaler Inhale 1-2 puffs into the lungs every 6 (six) hours as needed for wheezing or shortness of breath.   Yes Historical Provider, MD  albuterol (PROVENTIL) (2.5 MG/3ML) 0.083% nebulizer solution Take 2.5 mg by nebulization every 6 (six) hours as needed for wheezing or shortness of breath.   Yes Historical Provider, MD  albuterol-ipratropium (COMBIVENT) 18-103 MCG/ACT inhaler Inhale 2 puffs into the lungs every 6 (six) hours as needed for wheezing or shortness of breath.   Yes Historical Provider, MD  fluticasone (FLOVENT HFA) 44 MCG/ACT inhaler Inhale 2 puffs into the lungs 3 (three) times daily as needed (Shortness of Breath).    Yes Historical Provider, MD  gabapentin (NEURONTIN) 300 MG capsule Take 300-900 mg by mouth 3 (three) times daily. 1 in the morning, 1 midday, and 3 at night   Yes Historical Provider, MD  methadone (DOLOPHINE) 10 MG tablet Take 20 mg by mouth every 8 (eight) hours.   Yes Historical Provider, MD  Multiple Vitamins-Minerals (MULTIVITAMINS THER. W/MINERALS) TABS tablet Take 1 tablet by mouth daily.  Yes Historical Provider, MD  oxyCODONE-acetaminophen (PERCOCET) 10-325 MG per tablet Take 1 tablet by mouth every 4 (four) hours as needed for pain.   Yes Historical Provider, MD  potassium phosphate, monobasic, (K-PHOS ORIGINAL) 500 MG tablet Take 500 mg by mouth 2 (two) times a week.    Yes Historical Provider, MD    Social History:  reports that he has been smoking Cigarettes.  He has a 32 pack-year smoking history. He has quit using smokeless tobacco. He reports that he drinks alcohol. He reports that he does not use illicit drugs.  Family History  Problem Relation Age of Onset  . Hypertension Mother   . Cancer Mother   . Hypertension Father   .  Heart failure Father   . Cancer Father     Review of Systems:  Constitutional: Denies fever, chills, diaphoresis, appetite change and fatigue.  HEENT: Denies photophobia, eye pain, redness, hearing loss, ear pain, congestion, sore throat, rhinorrhea, sneezing, mouth sores, trouble swallowing, neck pain, neck stiffness and tinnitus.   Respiratory: Denies SOB, DOE, cough, chest tightness,  and wheezing.   Cardiovascular: Denies chest pain, palpitations and leg swelling.  Gastrointestinal: Denies constipation, blood in stool and abdominal distention.  Genitourinary: Denies dysuria, urgency, frequency, hematuria, flank pain and difficulty urinating.  Endocrine: Denies: hot or cold intolerance, sweats, changes in hair or nails, polyuria, polydipsia. Musculoskeletal: Denies myalgias, back pain, joint swelling, arthralgias and gait problem.  Skin: Denies pallor, rash and wound.  Neurological: Denies dizziness, seizures, syncope, weakness, light-headedness, numbness and headaches.  Hematological: Denies adenopathy. Easy bruising, personal or family bleeding history   Physical Exam: Blood pressure 129/71, pulse 102, temperature 98.2 F (36.8 C), temperature source Oral, resp. rate 16, height 6' 3"  (1.905 m), weight 121.564 kg (268 lb), SpO2 94 %. Gen: Alert, awake, oriented 3 HEENT: Normocephalic, atraumatic, pupils equal and reactive to light, moist mucous membranes Neck: Supple, no JVD, no lymphadenopathy, no bruits, no goiter. Cardiovascular: Regular rate and rhythm, no murmurs, rubs or gallops. Lungs: Clear to auscultation bilaterally. Abdomen: Obese, soft, nontender, nondistended, positive bowel sounds Extremities: No clubbing, cyanosis or edema, positive pulses. Neurologic: Grossly intact and nonfocal.   Labs on Admission:  Results for orders placed or performed during the hospital encounter of 11/11/14 (from the past 48 hour(s))  Comprehensive metabolic panel     Status: Abnormal    Collection Time: 11/11/14 11:29 AM  Result Value Ref Range   Sodium 138 135 - 145 mmol/L   Potassium 3.2 (L) 3.5 - 5.1 mmol/L   Chloride 93 (L) 101 - 111 mmol/L   CO2 29 22 - 32 mmol/L   Glucose, Bld 102 (H) 65 - 99 mg/dL   BUN 17 6 - 20 mg/dL   Creatinine, Ser 0.74 0.61 - 1.24 mg/dL   Calcium 8.6 (L) 8.9 - 10.3 mg/dL   Total Protein 7.6 6.5 - 8.1 g/dL   Albumin 4.4 3.5 - 5.0 g/dL   AST 152 (H) 15 - 41 U/L   ALT 69 (H) 17 - 63 U/L   Alkaline Phosphatase 89 38 - 126 U/L   Total Bilirubin 1.5 (H) 0.3 - 1.2 mg/dL   GFR calc non Af Amer >60 >60 mL/min   GFR calc Af Amer >60 >60 mL/min    Comment: (NOTE) The eGFR has been calculated using the CKD EPI equation. This calculation has not been validated in all clinical situations. eGFR's persistently <60 mL/min signify possible Chronic Kidney Disease.    Anion gap 16 (  H) 5 - 15  Ethanol (ETOH)     Status: Abnormal   Collection Time: 11/11/14 11:29 AM  Result Value Ref Range   Alcohol, Ethyl (B) 184 (H) <5 mg/dL    Comment:        LOWEST DETECTABLE LIMIT FOR SERUM ALCOHOL IS 5 mg/dL FOR MEDICAL PURPOSES ONLY   CBC     Status: Abnormal   Collection Time: 11/11/14 11:29 AM  Result Value Ref Range   WBC 8.5 4.0 - 10.5 K/uL   RBC 4.80 4.22 - 5.81 MIL/uL   Hemoglobin 15.0 13.0 - 17.0 g/dL   HCT 44.0 39.0 - 52.0 %   MCV 91.7 78.0 - 100.0 fL   MCH 31.3 26.0 - 34.0 pg   MCHC 34.1 30.0 - 36.0 g/dL   RDW 13.6 11.5 - 15.5 %   Platelets 100 (L) 150 - 400 K/uL    Comment: SPECIMEN CHECKED FOR CLOTS PLATELET COUNT CONFIRMED BY SMEAR   Acetaminophen level     Status: Abnormal   Collection Time: 11/11/14 11:29 AM  Result Value Ref Range   Acetaminophen (Tylenol), Serum <10 (L) 10 - 30 ug/mL    Comment:        THERAPEUTIC CONCENTRATIONS VARY SIGNIFICANTLY. A RANGE OF 10-30 ug/mL MAY BE AN EFFECTIVE CONCENTRATION FOR MANY PATIENTS. HOWEVER, SOME ARE BEST TREATED AT CONCENTRATIONS OUTSIDE THIS RANGE. ACETAMINOPHEN CONCENTRATIONS >150  ug/mL AT 4 HOURS AFTER INGESTION AND >50 ug/mL AT 12 HOURS AFTER INGESTION ARE OFTEN ASSOCIATED WITH TOXIC REACTIONS.   Salicylate level     Status: None   Collection Time: 11/11/14 11:29 AM  Result Value Ref Range   Salicylate Lvl <4.5 2.8 - 30.0 mg/dL  Lipase, blood     Status: None   Collection Time: 11/11/14 11:29 AM  Result Value Ref Range   Lipase 43 22 - 51 U/L  Urine rapid drug screen (hosp performed) (Not at [facility])     Status: None   Collection Time: 11/11/14  3:50 PM  Result Value Ref Range   Opiates NONE DETECTED NONE DETECTED   Cocaine NONE DETECTED NONE DETECTED   Benzodiazepines NONE DETECTED NONE DETECTED   Amphetamines NONE DETECTED NONE DETECTED   Tetrahydrocannabinol NONE DETECTED NONE DETECTED   Barbiturates NONE DETECTED NONE DETECTED    Comment:        DRUG SCREEN FOR MEDICAL PURPOSES ONLY.  IF CONFIRMATION IS NEEDED FOR ANY PURPOSE, NOTIFY LAB WITHIN 5 DAYS.        LOWEST DETECTABLE LIMITS FOR URINE DRUG SCREEN Drug Class       Cutoff (ng/mL) Amphetamine      1000 Barbiturate      200 Benzodiazepine   809 Tricyclics       983 Opiates          300 Cocaine          300 THC              50     Radiological Exams on Admission: No results found.  Assessment/Plan Principal Problem:   Acute alcoholic hallucinosis Active Problems:   Hypokalemia   ETOH abuse   Tobacco abuse   Transaminitis     Acute alcoholic hallucinosis -We'll admit to the hospital for a medical detox per patient's request. -Remains at high risk for DTs given the amount of alcohol that he ingests. -Last drink was today. -Placed on CIWA protocol with IV Ativan. -We'll request social work assistance, if possible patient would prefer an inpatient  rehabilitation.  Transaminitis -He has the typical AST: ALT split seen with alcohol use. -Monitor.  Hypokalemia -Replete, check magnesium level.  Alcohol abuse  -thiamine/folate -See above for details.  DVT  prophylaxis -Lovenox  CODE STATUS -Full code   Time Spent on Admission:  95 minutes  [facility] Pager: [phone] 11/11/2014, 4:47 PM

## 2014-11-12 DIAGNOSIS — F10231 Alcohol dependence with withdrawal delirium: Secondary | ICD-10-CM

## 2014-11-12 DIAGNOSIS — F10931 Alcohol use, unspecified with withdrawal delirium: Secondary | ICD-10-CM

## 2014-11-12 LAB — COMPREHENSIVE METABOLIC PANEL
ALT: 57 U/L (ref 17–63)
AST: 95 U/L — AB (ref 15–41)
Albumin: 3.9 g/dL (ref 3.5–5.0)
Alkaline Phosphatase: 76 U/L (ref 38–126)
Anion gap: 9 (ref 5–15)
BILIRUBIN TOTAL: 1.4 mg/dL — AB (ref 0.3–1.2)
BUN: 18 mg/dL (ref 6–20)
CHLORIDE: 99 mmol/L — AB (ref 101–111)
CO2: 30 mmol/L (ref 22–32)
Calcium: 8.4 mg/dL — ABNORMAL LOW (ref 8.9–10.3)
Creatinine, Ser: 0.6 mg/dL — ABNORMAL LOW (ref 0.61–1.24)
Glucose, Bld: 126 mg/dL — ABNORMAL HIGH (ref 65–99)
POTASSIUM: 4.1 mmol/L (ref 3.5–5.1)
Sodium: 138 mmol/L (ref 135–145)
TOTAL PROTEIN: 6.6 g/dL (ref 6.5–8.1)

## 2014-11-12 LAB — CBC
HEMATOCRIT: 38.3 % — AB (ref 39.0–52.0)
Hemoglobin: 12.6 g/dL — ABNORMAL LOW (ref 13.0–17.0)
MCH: 30.9 pg (ref 26.0–34.0)
MCHC: 32.9 g/dL (ref 30.0–36.0)
MCV: 93.9 fL (ref 78.0–100.0)
PLATELETS: 70 10*3/uL — AB (ref 150–400)
RBC: 4.08 MIL/uL — AB (ref 4.22–5.81)
RDW: 13.7 % (ref 11.5–15.5)
WBC: 6.9 10*3/uL (ref 4.0–10.5)

## 2014-11-12 LAB — MRSA PCR SCREENING: MRSA by PCR: NEGATIVE

## 2014-11-12 LAB — GLUCOSE, CAPILLARY: GLUCOSE-CAPILLARY: 103 mg/dL — AB (ref 65–99)

## 2014-11-12 MED ORDER — LORAZEPAM 2 MG/ML IJ SOLN
1.0000 mg | INTRAMUSCULAR | Status: DC | PRN
Start: 2014-11-12 — End: 2014-11-15
  Administered 2014-11-12 – 2014-11-15 (×14): 2 mg via INTRAVENOUS
  Filled 2014-11-12 (×14): qty 1

## 2014-11-12 MED ORDER — DEXMEDETOMIDINE HCL IN NACL 200 MCG/50ML IV SOLN
0.2000 ug/kg/h | INTRAVENOUS | Status: AC
  Administered 2014-11-12: 0.7 ug/kg/h via INTRAVENOUS
  Administered 2014-11-12: 0.2 ug/kg/h via INTRAVENOUS
  Administered 2014-11-12: 0.7 ug/kg/h via INTRAVENOUS
  Administered 2014-11-12: 0.4 ug/kg/h via INTRAVENOUS
  Administered 2014-11-12: 0.7 ug/kg/h via INTRAVENOUS
  Administered 2014-11-12: 0.3 ug/kg/h via INTRAVENOUS
  Administered 2014-11-12: 0.5 ug/kg/h via INTRAVENOUS
  Administered 2014-11-12 (×3): 0.7 ug/kg/h via INTRAVENOUS
  Administered 2014-11-13 (×2): 0.4 ug/kg/h via INTRAVENOUS
  Filled 2014-11-12 (×9): qty 50

## 2014-11-12 MED ORDER — DEXMEDETOMIDINE HCL IN NACL 200 MCG/50ML IV SOLN
INTRAVENOUS | Status: AC
  Filled 2014-11-12: qty 50

## 2014-11-12 NOTE — Progress Notes (Signed)
[facility] PROGRESS NOTE  Stanley Thompson ZOX:[id number] DOB: 02-Apr-1965 DOA: 11/11/2014 PCP: PROVIDER NOT IN SYSTEM  Assessment/Plan: DTs/Acute Alcoholic Hallucinosis -Required transfer to ICU and placement on a precedex drip. -Remains agitated and pulling at lines. -Continue CIWA protocol. -To see SW once out of DTs.  Hypokalemia -Repleted. -Mg ok at 2.0.  Transaminitis -Improving.  Alcohol Abuse -Continue thiamine/folate.  Code Status: Full Code Family Communication: Patient only  Disposition Plan: Keep in ICU today   Consultants:  None   Antibiotics:  None   Subjective: Confused, agitated, pulling at lines, redirectionable  Objective: Filed Vitals:   11/12/14 0634 11/12/14 0645 11/12/14 0702 11/12/14 0800  BP: 153/92 144/94 146/90 144/96  Pulse: 87 100 95 83  Temp:      TempSrc:      Resp: Height:      Weight:      SpO2: 93% 91% 91% 90%    Intake/Output Summary (Last 24 hours) at 11/12/14 0906 Last data filed at 11/12/14 8119  Gross per 24 hour  Intake  72.75 ml  Output      0 ml  Net  72.75 ml   Filed Weights   11/11/14 1029 11/12/14 0600  Weight: 121.564 kg (268 lb) 124 kg (273 lb 5.9 oz)    Exam:   General:  Awake, agitated  Cardiovascular: RRR, no M/R/G  Respiratory: CTA B  Abdomen: S/NT/ND/+BS/obese  Extremities: no C/C/E   Neurologic:  Non-focal  Data Reviewed: Basic Metabolic Panel:  Recent Labs Lab 11/11/14 1129 11/12/14 0830  NA 138 138  K 3.2* 4.1  CL 93* 99*  CO2 29 30  GLUCOSE 102* 126*  BUN 17 18  CREATININE 0.74 0.60*  CALCIUM 8.6* 8.4*  MG 2.0  --    Liver Function Tests:  Recent Labs Lab 11/11/14 1129 11/12/14 0830  AST 152* 95*  ALT 69* 57  ALKPHOS 89 76  BILITOT 1.5* 1.4*  PROT 7.6 6.6  ALBUMIN 4.4 3.9    Recent Labs Lab 11/11/14 1129  LIPASE 43   No results for input(s): AMMONIA in the last 168 hours. CBC:  Recent Labs Lab 11/11/14 1129  11/12/14 0830  WBC 8.5 6.9  HGB 15.0 12.6*  HCT 44.0 38.3*  MCV 91.7 93.9  PLT 100* 70*   Cardiac Enzymes: No results for input(s): CKTOTAL, CKMB, CKMBINDEX, TROPONINI in the last 168 hours. BNP (last 3 results) No results for input(s): BNP in the last 8760 hours.  ProBNP (last 3 results) No results for input(s): PROBNP in the last 8760 hours.  CBG: No results for input(s): GLUCAP in the last 168 hours.  No results found for this or any previous visit (from the past 240 hour(s)).   Studies: No results found.  Scheduled Meds: . docusate sodium  100 mg Oral BID  . enoxaparin (LOVENOX) injection  40 mg Subcutaneous Q24H  . folic acid  1 mg Oral Daily  . gabapentin  300-900 mg Oral TID  . methadone  20 mg Oral 3 times per day  . multivitamin with minerals  1 tablet Oral Daily  . multivitamin with minerals  1 tablet Oral Daily  . sodium chloride  3 mL Intravenous Q12H  . thiamine  100 mg Oral Daily   Or  . thiamine  100 mg Intravenous Daily   Continuous Infusions: . sodium chloride 75 mL/hr at 11/12/14 0556  . dexmedetomidine 0.7 mcg/kg/hr (11/12/14 1478)    Principal Problem:  Acute alcoholic hallucinosis Active Problems:   DTs (delirium tremens)   Hypokalemia   ETOH abuse   Tobacco abuse   Transaminitis    Time spent: 25 minutes. Greater than 50% of this time was spent in direct contact with the patient coordinating care.    Chaya Jan  [facility] Pager [phone]  If 7PM-7AM, please contact night-coverage at [email or web address], password [facility] 11/12/2014, 9:06 AM  LOS: 1 day

## 2014-11-12 NOTE — Progress Notes (Signed)
Attempted to call patients spouse with no success.  Notified ICU nurse.

## 2014-11-12 NOTE — Progress Notes (Signed)
Approximately 0200 patient confused and pulling at cords and wires.  Pulled his IV out at 0230 and a new one was placed in the right hand and ativan was given.  Approximately 0400 Patient still hallucinating and pulling at tubes and wires,  patient also trying to push past staff to leave.  Security called and patient returned to bed. New IV started in the right wrist.  Morning dose of CWIA ativan given at approximately 0430 and morning dose of methadone given at approximately 0445.  Patient still trying to get out of bed and pull out tubes and wires.  Patient talking to people who are not there and picking at things that are not there.  Visible tremors and sweating.  Patient is also having nausea vomiting.  On call MD notified and new orders received to transfer patient to ICU.  Bed request sent and report given to Tracie.  Patient transferred to ICU bed 4 with out incident.  Will notify the patients family of transfer.

## 2014-11-13 LAB — BASIC METABOLIC PANEL
Anion gap: 8 (ref 5–15)
BUN: 14 mg/dL (ref 6–20)
CALCIUM: 8.5 mg/dL — AB (ref 8.9–10.3)
CO2: 29 mmol/L (ref 22–32)
Chloride: 102 mmol/L (ref 101–111)
Creatinine, Ser: 0.55 mg/dL — ABNORMAL LOW (ref 0.61–1.24)
GFR calc Af Amer: >60 mL/min (ref >60)
Glucose, Bld: 91 mg/dL (ref 65–99)
POTASSIUM: 4.2 mmol/L (ref 3.5–5.1)
SODIUM: 139 mmol/L (ref 135–145)

## 2014-11-13 MED ORDER — NICOTINE 14 MG/24HR TD PT24
14.0000 mg | MEDICATED_PATCH | Freq: Every day | TRANSDERMAL | Status: DC
  Administered 2014-11-13 – 2014-11-15 (×3): 14 mg via TRANSDERMAL
  Filled 2014-11-13 (×3): qty 1

## 2014-11-13 NOTE — Progress Notes (Signed)
[facility] PROGRESS NOTE  Stanley Thompson ZOX:[id number] DOB: 08/19/1965 DOA: 11/11/2014 PCP: PROVIDER NOT IN SYSTEM  Assessment/Plan: DTs/Acute Alcoholic Hallucinosis -Improved today; still with tremors, but coherent. -Continue CIWA protocol. -Remains on precedex drip. -Will ask SW to see today or tomorrow for ETOH resources (patient interested in inpatient rehab).  Hypokalemia -Repleted. -Mg ok at 2.0.  Transaminitis -Improving. -Typical AST:ALT split seen with alcoholism.  Alcohol Abuse -Continue thiamine/folate.  Tobacco Abuse -Nicotine patch.  Code Status: Full Code Family Communication: Patient only  Disposition Plan: Keep in ICU today; consider transfer to floor this afternoon if off precedex drip.   Consultants:  None   Antibiotics:  None   Subjective: More oriented today, c/o tremors and requests a nicotine patch.  Objective: Filed Vitals:   11/13/14 0545 11/13/14 0600 11/13/14 0615 11/13/14 0730  BP: 123/74 119/81    Pulse: 77 79 77   Temp:    98.7 F (37.1 C)  TempSrc:    Axillary  Resp: Height:      Weight:      SpO2: 94% 94% 92%     Intake/Output Summary (Last 24 hours) at 11/13/14 0930 Last data filed at 11/13/14 0600  Gross per 24 hour  Intake 2662.35 ml  Output   1225 ml  Net 1437.35 ml   Filed Weights   11/11/14 1029 11/12/14 0600 11/13/14 0500  Weight: 121.564 kg (268 lb) 124 kg (273 lb 5.9 oz) 125.2 kg (276 lb 0.3 oz)    Exam:   General:  Awake, oriented, tremerous  Cardiovascular: RRR, no M/R/G  Respiratory: CTA B  Abdomen: S/NT/ND/+BS/obese  Extremities: no C/C/E   Neurologic:  Non-focal  Data Reviewed: Basic Metabolic Panel:  Recent Labs Lab 11/11/14 1129 11/12/14 0830 11/13/14 0422  NA 138 138 139  K 3.2* 4.1 4.2  CL 93* 99* 102  CO2 GLUCOSE 102* 126* 91  BUN CREATININE 0.74 0.60* 0.55*  CALCIUM 8.6* 8.4* 8.5*  MG 2.0  --   --    Liver Function  Tests:  Recent Labs Lab 11/11/14 1129 11/12/14 0830  AST 152* 95*  ALT 69* 57  ALKPHOS 89 76  BILITOT 1.5* 1.4*  PROT 7.6 6.6  ALBUMIN 4.4 3.9    Recent Labs Lab 11/11/14 1129  LIPASE 43   No results for input(s): AMMONIA in the last 168 hours. CBC:  Recent Labs Lab 11/11/14 1129 11/12/14 0830  WBC 8.5 6.9  HGB 15.0 12.6*  HCT 44.0 38.3*  MCV 91.7 93.9  PLT 100* 70*   Cardiac Enzymes: No results for input(s): CKTOTAL, CKMB, CKMBINDEX, TROPONINI in the last 168 hours. BNP (last 3 results) No results for input(s): BNP in the last 8760 hours.  ProBNP (last 3 results) No results for input(s): PROBNP in the last 8760 hours.  CBG:  Recent Labs Lab 11/12/14 1917  GLUCAP 103*    Recent Results (from the past 240 hour(s))  MRSA PCR Screening     Status: None   Collection Time: 11/12/14  5:52 AM  Result Value Ref Range Status   MRSA by PCR NEGATIVE NEGATIVE Final    Comment:        The GeneXpert MRSA Assay (FDA approved for NASAL specimens only), is one component of a comprehensive MRSA colonization surveillance program. It is not intended to diagnose MRSA infection nor to guide or monitor treatment for MRSA infections.  Studies: No results found.  Scheduled Meds: . docusate sodium  100 mg Oral BID  . enoxaparin (LOVENOX) injection  40 mg Subcutaneous Q24H  . folic acid  1 mg Oral Daily  . gabapentin  300-900 mg Oral TID  . methadone  20 mg Oral 3 times per day  . multivitamin with minerals  1 tablet Oral Daily  . sodium chloride  3 mL Intravenous Q12H  . thiamine  100 mg Oral Daily   Or  . thiamine  100 mg Intravenous Daily   Continuous Infusions: . sodium chloride 75 mL/hr at 11/13/14 0439    Principal Problem:   Acute alcoholic hallucinosis Active Problems:   DTs (delirium tremens)   Hypokalemia   ETOH abuse   Tobacco abuse   Transaminitis    Time spent: 25 minutes. Greater than 50% of this time was spent in direct contact  with the patient coordinating care.    Chaya Jan  [facility] Pager [phone]  If 7PM-7AM, please contact night-coverage at [email or web address], password [facility] 11/13/2014, 9:30 AM  LOS: 2 days

## 2014-11-13 NOTE — Clinical Social Work Note (Signed)
Clinical Social Work Assessment  Patient Details  Name: Stanley Thompson MRN: [id number] Date of Birth: May 22, 1965  Date of referral:  11/13/14               Reason for consult:  Substance Use/ETOH Abuse                Permission sought to share information with:    Permission granted to share information::     Name::        Agency::     Relationship::     Contact Information:     Housing/Transportation Living arrangements for the past 2 months:  Single Family Home Source of Information:  Patient Patient Interpreter Needed:  None Criminal Activity/Legal Involvement Pertinent to Current Situation/Hospitalization:  No - Comment as needed Significant Relationships:  Spouse Lives with:  Spouse, Adult Children Do you feel safe going back to the place where you live?  Yes Need for family participation in patient care:  Yes (Comment)  Care giving concerns:  None identified .   Social Worker assessment / plan:  Patient stated "I'm an alcoholic. I like it too much" when CSW inquired about his current hospitalization.  Patient stated that he llives with his wife, step daughter and step grandson.  He stated that he is currently unemployed and seeking disability.  Patient advised that he uses a cane sometimes when his back is hurting him.  He stated that he overdosed on alcohol about five years ago.  He indicated that he has had rehab in the past.  Patient advised that he is able to go periods without drinking.  CSW completed SBIRT. CSW discussed readiness for treatment. Patient advised that he was ready for treatment. CSW provided alcohol information sheets, as well as information on inpatient and outpatient rehab.  CSW circled the inpatient facilities that were self pay.  CSW offered to contact places to set up treatment.  Patient declined and advised that his wife would set up his treatment for him.    CSW signing off.   Employment status:  Unemployed Health and safety inspector:  Self Pay  (Medicaid Pending) PT Recommendations:  Not assessed at this time Information / Referral to community resources:  Inpatient Psychiatric Care (Comment Required), Outpatient Substance Abuse Treatment Options  Patient/Family's Response to care: Patient is agreeable to go to rehab.  Patient/Family's Understanding of and Emotional Response to Diagnosis, Current Treatment, and Prognosis:  Patient understands that he has a problem with alcohol.  Emotional Assessment Appearance:  Developmentally appropriate Attitude/Demeanor/Rapport:   (Cooperative) Affect (typically observed):  Calm Orientation:  Oriented to Self, Oriented to Place, Oriented to  Time, Oriented to Situation Alcohol / Substance use:  Alcohol Use Psych involvement (Current and /or in the community):  No (Comment)  Discharge Needs  Concerns to be addressed:  Substance Abuse Concerns Readmission within the last 30 days:  No Current discharge risk:  Substance Abuse Barriers to Discharge:  No Barriers Identified   Annice Needy, LCSW 11/13/2014, 12:17 PM [phone]

## 2014-11-14 LAB — BASIC METABOLIC PANEL
Anion gap: 8 (ref 5–15)
BUN: 9 mg/dL (ref 6–20)
CALCIUM: 8.4 mg/dL — AB (ref 8.9–10.3)
CO2: 29 mmol/L (ref 22–32)
CREATININE: 0.75 mg/dL (ref 0.61–1.24)
Chloride: 99 mmol/L — ABNORMAL LOW (ref 101–111)
GFR calc Af Amer: >60 mL/min (ref >60)
Glucose, Bld: 109 mg/dL — ABNORMAL HIGH (ref 65–99)
POTASSIUM: 3.8 mmol/L (ref 3.5–5.1)
SODIUM: 136 mmol/L (ref 135–145)

## 2014-11-14 NOTE — Progress Notes (Signed)
Patient report given to M. Rubye Oaks, RN. Patient is stable, IV is patent, NSR, on 2L of oxygen, expiratory wheezes on R side, mild tremors. Will be transferred to Room 325.

## 2014-11-14 NOTE — Progress Notes (Signed)
[facility] PROGRESS NOTE  Stanley Thompson ZOX:[id number] DOB: Oct 22, 1965 DOA: 11/11/2014 PCP: PROVIDER NOT IN SYSTEM  Assessment/Plan: DTs/Acute Alcoholic Hallucinosis -Improved today; still with tremors, but coherent. -Continue CIWA protocol. -Off precedex drip. -SW saw him and apparently he refused inpatient treatment.  Hypokalemia -Repleted. -Mg ok at 2.0.  Transaminitis -Improving. -Typical AST:ALT split seen with alcoholism.  Alcohol Abuse -Continue thiamine/folate.  Tobacco Abuse -Nicotine patch.  Code Status: Full Code Family Communication: Patient only  Disposition Plan: Transfer to floor   Consultants:  None   Antibiotics:  None   Subjective: More oriented today, c/o tremors and shakiness.  Objective: Filed Vitals:   11/14/14 0600 11/14/14 0700 11/14/14 0748 11/14/14 0800  BP:  119/101  103/80  Pulse: 116 109  116  Temp:   99.2 F (37.3 C)   TempSrc:   Oral   Resp: 13 15  14   Height:      Weight:      SpO2: 95% 96%  97%    Intake/Output Summary (Last 24 hours) at 11/14/14 0924 Last data filed at 11/14/14 0800  Gross per 24 hour  Intake   2430 ml  Output   2300 ml  Net    130 ml   Filed Weights   11/11/14 1029 11/12/14 0600 11/13/14 0500  Weight: 121.564 kg (268 lb) 124 kg (273 lb 5.9 oz) 125.2 kg (276 lb 0.3 oz)    Exam:   General:  Awake, oriented, tremerous  Cardiovascular: RRR, no M/R/G  Respiratory: CTA B  Abdomen: S/NT/ND/+BS/obese  Extremities: no C/C/E   Neurologic:  Non-focal  Data Reviewed: Basic Metabolic Panel:  Recent Labs Lab 11/11/14 1129 11/12/14 0830 11/13/14 0422 11/14/14 0502  NA 138 138 139 136  K 3.2* 4.1 4.2 3.8  CL 93* 99* 102 99*  CO2 29 30 29 29   GLUCOSE 102* 126* 91 109*  BUN 17 18 14 9   CREATININE 0.74 0.60* 0.55* 0.75  CALCIUM 8.6* 8.4* 8.5* 8.4*  MG 2.0  --   --   --    Liver Function Tests:  Recent Labs Lab 11/11/14 1129 11/12/14 0830  AST 152* 95*  ALT  69* 57  ALKPHOS 89 76  BILITOT 1.5* 1.4*  PROT 7.6 6.6  ALBUMIN 4.4 3.9    Recent Labs Lab 11/11/14 1129  LIPASE 43   No results for input(s): AMMONIA in the last 168 hours. CBC:  Recent Labs Lab 11/11/14 1129 11/12/14 0830  WBC 8.5 6.9  HGB 15.0 12.6*  HCT 44.0 38.3*  MCV 91.7 93.9  PLT 100* 70*   Cardiac Enzymes: No results for input(s): CKTOTAL, CKMB, CKMBINDEX, TROPONINI in the last 168 hours. BNP (last 3 results) No results for input(s): BNP in the last 8760 hours.  ProBNP (last 3 results) No results for input(s): PROBNP in the last 8760 hours.  CBG:  Recent Labs Lab 11/12/14 1917  GLUCAP 103*    Recent Results (from the past 240 hour(s))  MRSA PCR Screening     Status: None   Collection Time: 11/12/14  5:52 AM  Result Value Ref Range Status   MRSA by PCR NEGATIVE NEGATIVE Final    Comment:        The GeneXpert MRSA Assay (FDA approved for NASAL specimens only), is one component of a comprehensive MRSA colonization surveillance program. It is not intended to diagnose MRSA infection nor to guide or monitor treatment for MRSA infections.      Studies: No results found.  Scheduled Meds: . docusate sodium  100 mg Oral BID  . enoxaparin (LOVENOX) injection  40 mg Subcutaneous Q24H  . folic acid  1 mg Oral Daily  . gabapentin  300-900 mg Oral TID  . methadone  20 mg Oral 3 times per day  . multivitamin with minerals  1 tablet Oral Daily  . nicotine  14 mg Transdermal Daily  . sodium chloride  3 mL Intravenous Q12H  . thiamine  100 mg Oral Daily   Or  . thiamine  100 mg Intravenous Daily   Continuous Infusions: . sodium chloride 75 mL/hr at 11/14/14 0800    Principal Problem:   Acute alcoholic hallucinosis Active Problems:   DTs (delirium tremens)   Hypokalemia   ETOH abuse   Tobacco abuse   Transaminitis    Time spent: 25 minutes. Greater than 50% of this time was spent in direct contact with the patient coordinating  care.    Chaya Jan  [facility] Pager [phone]  If 7PM-7AM, please contact night-coverage at [email or web address], password [facility] 11/14/2014, 9:24 AM  LOS: 3 days

## 2014-11-14 NOTE — Plan of Care (Signed)
Problem: Phase II Progression Outcomes Goal: Progress activity as tolerated unless otherwise ordered Outcome: Progressing Pt is starting to move around in bed more Goal: Discharge plan established Outcome: Progressing csw will be evaluating pt for possible in house pyscyiatric tx

## 2014-11-14 NOTE — Care Management Note (Signed)
Case Management Note  Patient Details  Name: Stanley Thompson MRN: [id number] Date of Birth: Aug 19, 1965  Expected Discharge Date:                  Expected Discharge Plan:  Home/Self Care  In-House Referral:  Clinical Social Work  Discharge planning Services  CM Consult  Post Acute Care Choice:  NA Choice offered to:  NA  DME Arranged:    DME Agency:     HH Arranged:    HH Agency:     Status of Service:  In process, will continue to follow  Medicare Important Message Given:    Date Medicare IM Given:    Medicare IM give by:    Date Additional Medicare IM Given:    Additional Medicare Important Message give by:     If discussed at Long Length of Stay Meetings, dates discussed:    Additional Comments: Pt admitted for ETOH withdrawal. Pt is from home with family and ind at baseline. Pt is unemployed and uninsured. Pt has been referred to [facility]. Pt in process of filling for disability. CSW has been consulted for substance abuse. Pt goes to the [facility] for PCP care. Pt plans to return home with self care at DC. Pt may need MATCH voucher if new meds ordered. Will cont to follow for DC planning.   Malcolm Metro, RN 11/14/2014, 1:32 PM

## 2014-11-15 MED ORDER — THIAMINE HCL 100 MG PO TABS: 100.0000 mg | ORAL_TABLET | Freq: Every day | ORAL | Status: AC

## 2014-11-15 MED ORDER — NICOTINE 14 MG/24HR TD PT24: 14.0000 mg | MEDICATED_PATCH | Freq: Every day | TRANSDERMAL | Status: AC

## 2014-11-15 MED ORDER — FOLIC ACID 1 MG PO TABS: 1.0000 mg | ORAL_TABLET | Freq: Every day | ORAL | Status: AC

## 2014-11-15 MED ORDER — METHADONE HCL 10 MG PO TABS: 20.0000 mg | ORAL_TABLET | Freq: Three times a day (TID) | ORAL | Status: AC

## 2014-11-15 MED ORDER — GABAPENTIN 300 MG PO CAPS: 300.0000 mg | ORAL_CAPSULE | Freq: Three times a day (TID) | ORAL | Status: AC

## 2014-11-15 NOTE — Clinical Social Work Note (Signed)
CSW met with patient to discuss inpatient and outpatient alcohol rehab needs and decisions.  Patient advised that his friend, Deidre Ala, is going to help him.  He stated that he resides in [place], [place]., and the [place] [facility] and [place] meetings have "the same group of bums."  Patient advised that he attended NA around age 49 and again around age 45 and it was the "same SOBs drinking the same free coffee and telling the same free stories" both times.  Patient advised that there are three places in [place], [place] that he can go to for alcohol rehab.  He named the three agencies as [facility], [facility] and a place behind [facility] as alcohol resources.  Patient advised that his wife drinks alcohol everyday.  He stated that she works and when she comes home she makes herself a mixed drink, "so alcohol is there" (referring to alcohol always being in the home).  Patient advised that his wife is currently upset with him because he was "talking mess" to some young girls.  He indicated that he would never cheat on his wife with the young girls, he was just messing around and "running my mouth." Patient advised that his friend Deidre Ala is going to come and get him upon discharge.  Patient advised that he was once on Gabapentin and that the medication decreased his desire for alcohol.  He stated that he would like for the attending to write him a prescription for the medication to help with nerve issues in his feet and to decrease the desire for alcohol.  Patient stated that he will follow-up with [facility] to maintain ongoing refills.  CSW discussed in-patient treatment.  Patient indicated that he does not want to do in-patient treatment because he has $9000.00 worth of tools on his truck that he is afraid that will be stolen while he is gone.  Patient stated "I think I can handle it" referring to his arrangement of SA services.  Patient identified that he has his brother and his  friend, Deidre Ala, who will support him in his recovery efforts.  Patient advised that he may go to [facility] because he can pay privately and walk in without an appointment.  CSW discussed the importance of a sober environment.  Patient advised that he will stay in his basement where there are two bedrooms and a living room if necessary to stay away from the alcohol in the home.  Patient indicated that he did not desire for CSW to schedule any appointments for him.    CSW signing off.   Ihor Gully,  [phone]

## 2014-11-15 NOTE — Care Management Note (Signed)
Case Management Note  Patient Details  Name: Stanley Thompson MRN: [id number] Date of Birth: 07/18/1965  Subjective/Objective:                    Action/Plan:   Expected Discharge Date:                  Expected Discharge Plan:  Home/Self Care  In-House Referral:  Clinical Social Work  Discharge planning Services  CM Consult  Post Acute Care Choice:  NA Choice offered to:  NA  DME Arranged:    DME Agency:     HH Arranged:    HH Agency:     Status of Service:  Completed, signed off  Medicare Important Message Given:    Date Medicare IM Given:    Medicare IM give by:    Date Additional Medicare IM Given:    Additional Medicare Important Message give by:     If discussed at Long Length of Stay Meetings, dates discussed:    Additional Comments: Pt discharged home today. No CM needs noted. Pt has neb machine for home use. Arlyss Queen [place], RN 11/15/2014, 11:35 AM

## 2014-11-15 NOTE — Progress Notes (Addendum)
1 mg of ativan wasted in the sharps.   Witness of 1 mg of ativan wasted in the sharps - Sherrye Payor RN

## 2014-11-15 NOTE — Progress Notes (Signed)
Patient discharging home. Vitals stable. Discharge instructions reviewed with patient. Patient encouraged to stop smoking and drinking and educated on the importance of stopping. Patient verbalized understanding. Patient assisted off unit.

## 2014-11-15 NOTE — Discharge Summary (Signed)
Physician Discharge Summary  CLOY COZZENS VHQ:[id number] DOB: [birth year] DOA: 11/11/2014  PCP: PROVIDER NOT IN SYSTEM  Admit date: 11/11/2014 Discharge date: 11/15/2014  Time spent: 45 minutes  Recommendations for Outpatient Follow-up:  -Will be discharged home today. -Was provided with inpatient and outpatient ETOH cessation resources by SW, but refused to have [place] set it up for him. -Will need follow up with PCP in 2 weeks.   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 He states that for the past 9 days he has been binge drinking and is drinking a liter to a liter and a half of vodka, every day. Over the past 2 days he has been seeing things on the wall, describes little black dots moving up and down the wall. Shows me the smoke detector in his room and tells me that it has a big mouth with big teeth that is opening and closing. He has also been having some vomiting. He came to the emergency department because he wants assistance with alcohol cessation. in the ED he was found to be hypokalemic, with transaminitis. Alcohol level was 184. We are asked to admit him for further evaluation and management.   Hospital Course:   DTs/Acute Alcoholic Hallucinosis -Resolved; still with tremors, but coherent. -Required a precedex drip in addition to ativan per CIWA protocol. -Was provided with ETOH cessation resources. Refused assistance with placement into an inpatient  program.  Hypokalemia -Repleted. -Mg ok at 2.0.  Transaminitis -Improving. -Typical AST:ALT split seen with alcoholism.  Alcohol Abuse -Continue thiamine/folate.  Tobacco Abuse -Nicotine patch.  Procedures:  None   Consultations:  None  Discharge Instructions  Discharge Instructions    Increase activity slowly    Complete by:  As directed             Medication List    STOP taking these medications        COMBIVENT 18-103 MCG/ACT inhaler  Generic drug:  albuterol-ipratropium     potassium phosphate (monobasic) 500 MG tablet  Commonly known as:  K-PHOS ORIGINAL      TAKE these medications        albuterol (2.5 MG/3ML) 0.083% nebulizer solution  Commonly known as:  PROVENTIL  Take 2.5 mg by nebulization every 6 (six) hours as needed for wheezing or shortness of breath.     albuterol 108 (90 BASE) MCG/ACT inhaler  Commonly known as:  PROVENTIL HFA;VENTOLIN HFA  Inhale 1-2 puffs into the lungs every 6 (six) hours as needed for wheezing or shortness of breath.     fluticasone 44 MCG/ACT inhaler  Commonly known as:  FLOVENT HFA  Inhale 2 puffs into the lungs 3 (three) times daily as needed (Shortness of Breath).     folic acid 1 MG tablet  Commonly known as:  FOLVITE  Take 1 tablet (1 mg total) by mouth daily.     gabapentin 300 MG capsule  Commonly known as:  NEURONTIN  Take 1-3 capsules (300-900 mg total) by mouth 3 (three) times daily. 1 in the morning, 1 midday, and 3 at night     methadone 10 MG tablet  Commonly known as:  DOLOPHINE  Take 2 tablets (20 mg total) by mouth every 8 (eight) hours.     multivitamins ther. w/minerals Tabs tablet  Take 1 tablet by mouth daily.     nicotine 14 mg/24hr patch  Commonly known as:  NICODERM CQ - dosed in mg/24 hours  Place 1 patch (14 mg total) onto the skin daily.     oxyCODONE-acetaminophen 10-325 MG per tablet  Commonly known as:  PERCOCET  Take 1 tablet by mouth every 4 (four) hours as needed for  pain.     thiamine 100 MG tablet  Take 1 tablet (100 mg total) by mouth daily.       Allergies  Allergen Reactions  . Advair Diskus [Fluticasone-Salmeterol] Anaphylaxis      The results of significant diagnostics from this hospitalization (including imaging, microbiology, ancillary and laboratory) are listed below for reference.    Significant Diagnostic Studies: No results found.  Microbiology: Recent Results (from the past 240 hour(s))  MRSA PCR Screening     Status: None   Collection Time: 11/12/14  5:52 AM  Result Value Ref Range Status   MRSA by PCR NEGATIVE NEGATIVE Final    Comment:        The GeneXpert MRSA Assay (FDA approved for NASAL specimens only), is one component of a comprehensive MRSA colonization surveillance program. It is not intended to diagnose MRSA infection nor to guide or monitor treatment for MRSA infections.      Labs: Basic Metabolic Panel:  Recent Labs Lab 11/11/14 1129 11/12/14 0830 11/13/14 0422 11/14/14 0502  NA 138 138 139 136  K 3.2* 4.1 4.2 3.8  CL 93* 99* 102 99*  CO2 GLUCOSE 102* 126* 91 109*  BUN CREATININE 0.74 0.60* 0.55* 0.75  CALCIUM 8.6* 8.4* 8.5* 8.4*  MG 2.0  --   --   --    Liver Function Tests:  Recent Labs Lab 11/11/14 1129 11/12/14 0830  AST 152* 95*  ALT 69* 57  ALKPHOS 89 76  BILITOT 1.5* 1.4*  PROT 7.6 6.6  ALBUMIN 4.4 3.9    Recent Labs Lab 11/11/14 1129  LIPASE 43   No results for input(s): AMMONIA in the last 168 hours. CBC:  Recent Labs Lab 11/11/14 1129 11/12/14 0830  WBC 8.5 6.9  HGB 15.0 12.6*  HCT 44.0 38.3*  MCV 91.7 93.9  PLT 100* 70*   Cardiac Enzymes: No results for input(s): CKTOTAL, CKMB, CKMBINDEX, TROPONINI in the last 168 hours. BNP: BNP (last 3 results) No results for input(s): BNP in the last 8760 hours.  ProBNP (last 3 results) No results for input(s): PROBNP in the last 8760 hours.  CBG:  Recent Labs Lab 11/12/14 1917    GLUCAP 103*       Signed:  Chaya Jan  [facility] Pager: [phone] 11/15/2014, 2:38 PM

## 2015-04-24 IMAGING — US US ABDOMEN LIMITED
1 series · 14 of 25 positions shown · non-contrast
Comparison: CT 08/19/2012

CLINICAL DATA: Pancreatitis

LIMITED ABDOMINAL ULTRASOUND - RIGHT UPPER QUADRANT

[Series 1: us abdomen limited · 0.30mm/px · 14 of 61 slices shown]
[im 1/61]
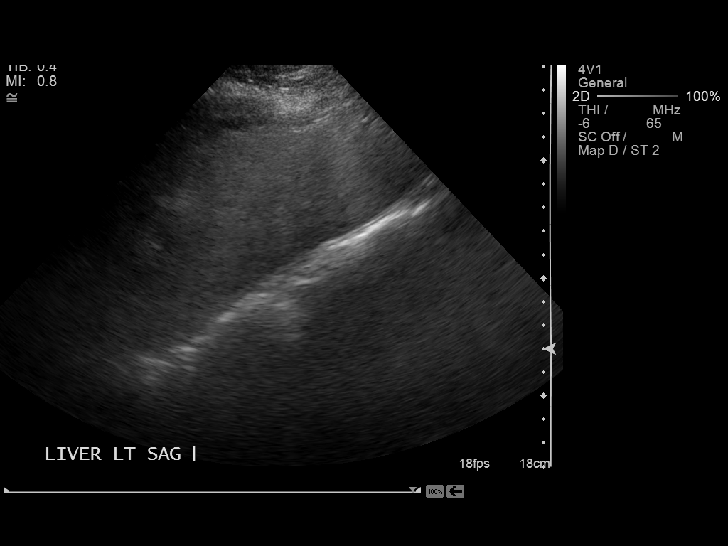
[im 6/61]
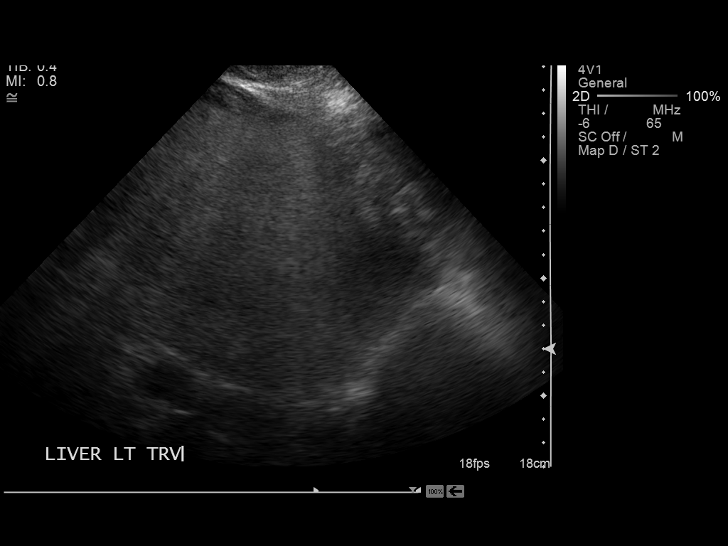
[im 11/61]
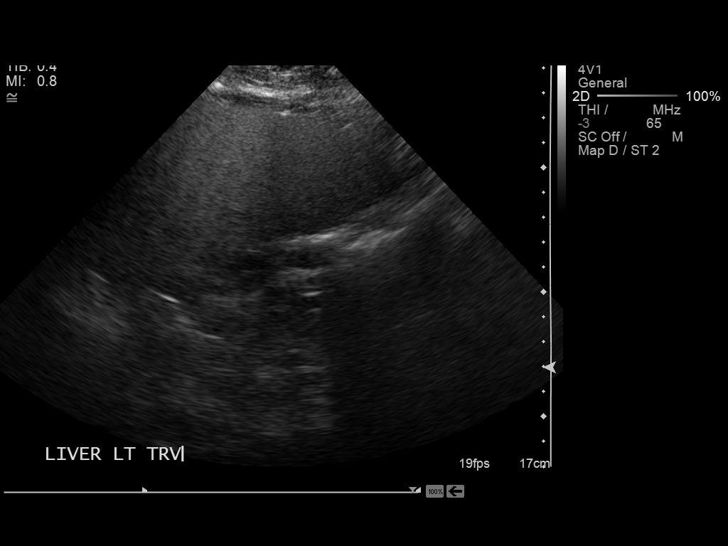
[im 16/61]
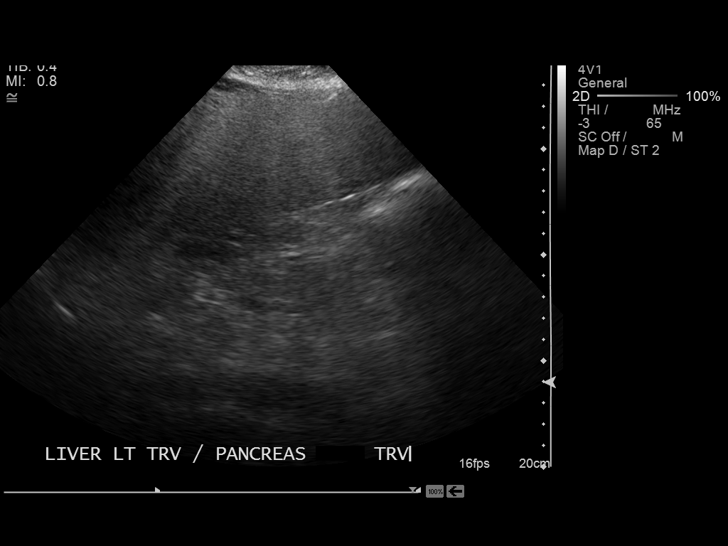
[im 21/61]
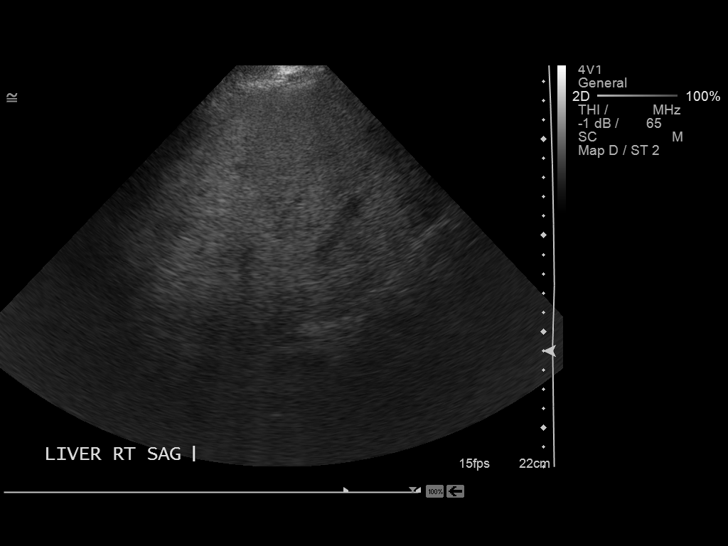
[im 23/61]
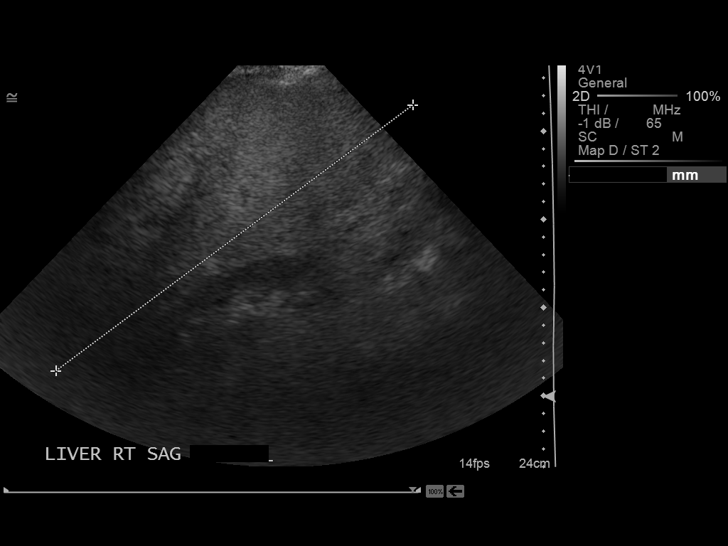
[im 28/61]
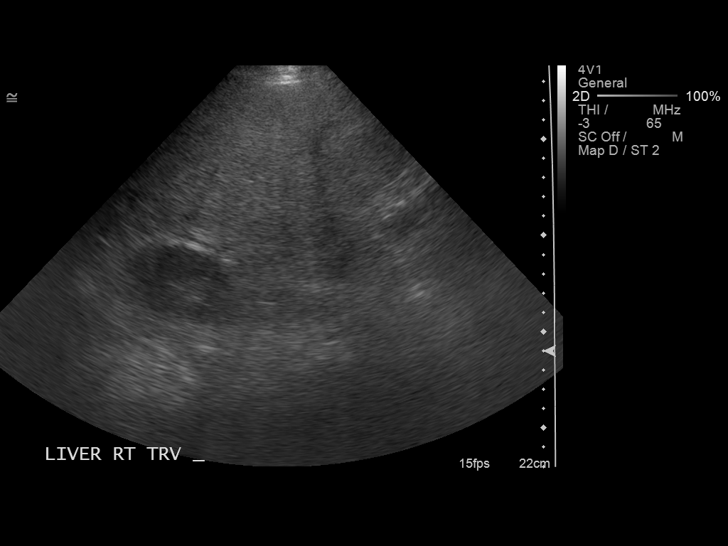
[im 33/61]
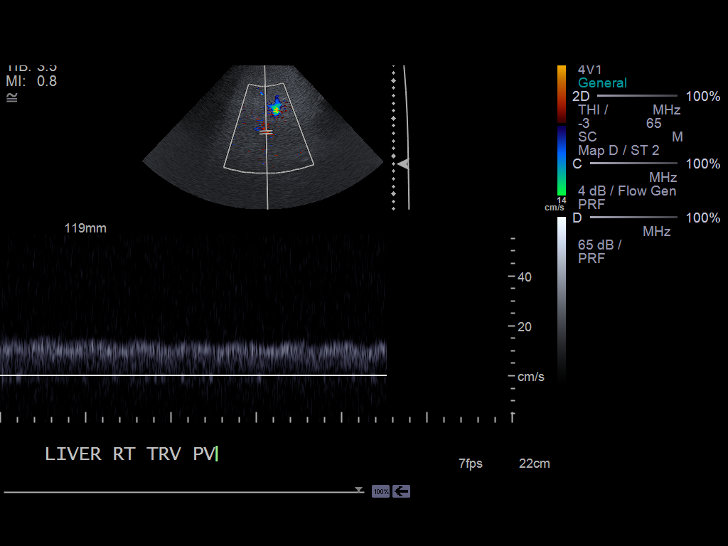
[im 38/61]
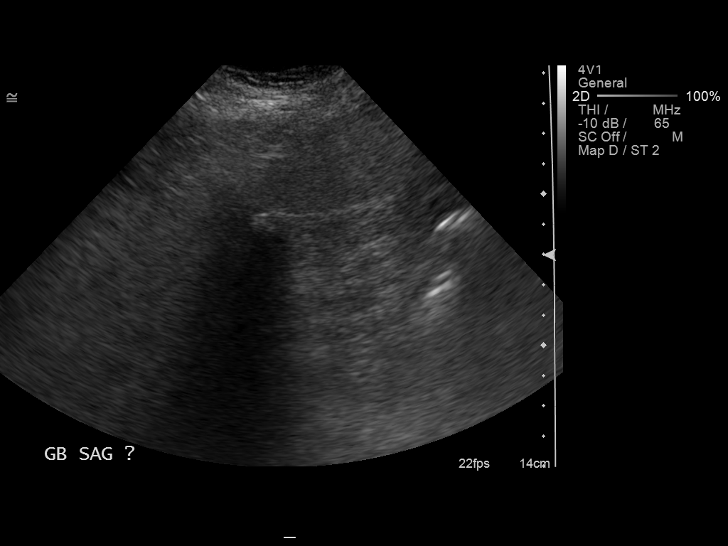
[im 41/61]
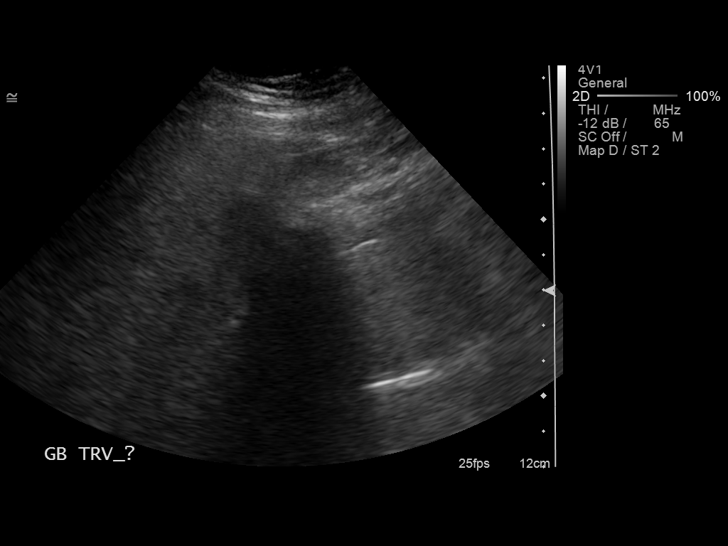
[im 46/61]
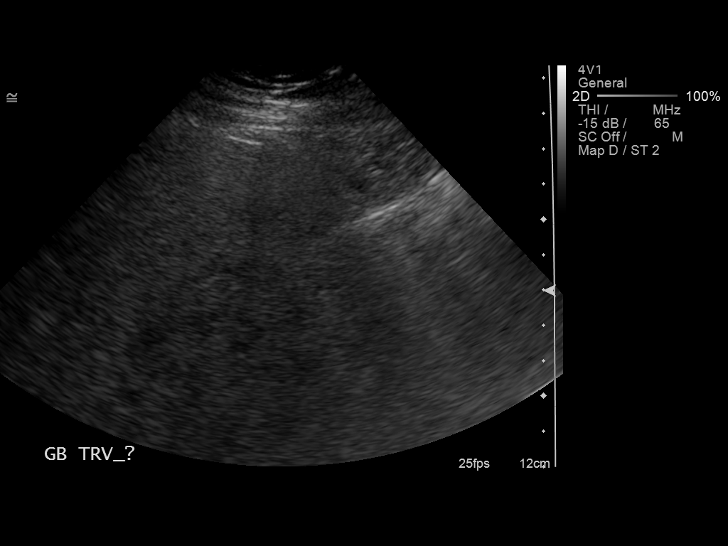
[im 51/61]
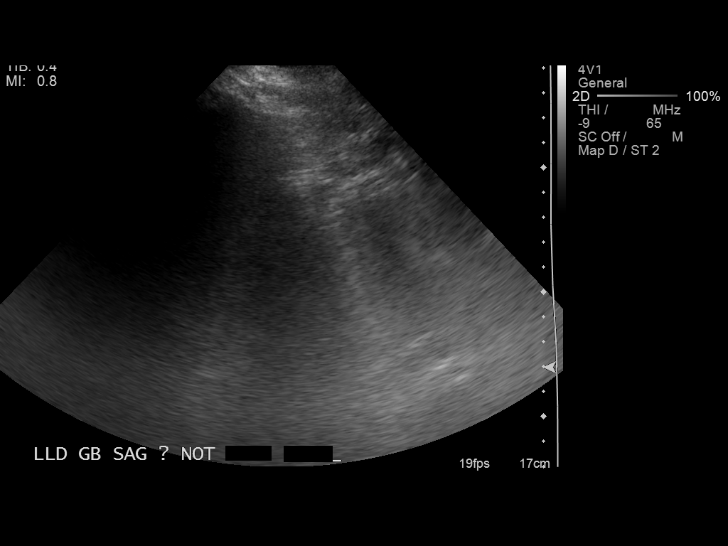
[im 56/61]
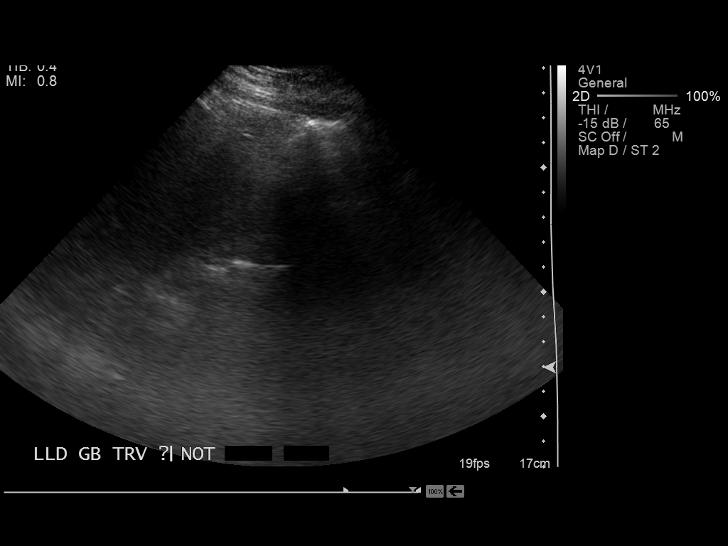
[im 61/61]
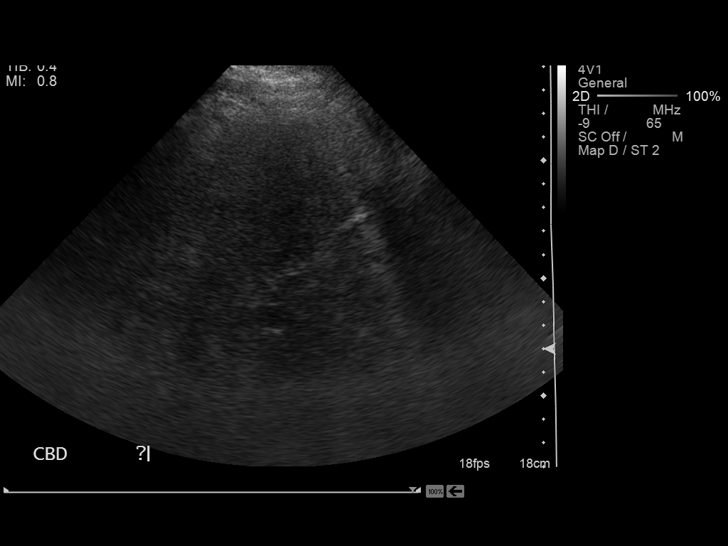

[14 of 25 positions shown; findings below may reference images not displayed]

FINDINGS: Gallbladder:  Gallbladder poorly visualized secondary to gallstones
within the lumen.

Common bile duct:  Not well seen secondary to increased hepatic
echogenicity.  Appears normal at 5 mm.

Liver:  The liver is increased in parenchymal echogenicity.  No
ductal dilatation.
IMPRESSION: 1..  Cholelithiasis.
2.  Echogenic liver commonly represents hepatic steatosis.  Cannot
exclude underlying hepatocellular disease.
3.  Exam is somewhat limited due to increased liver echogenicity.

## 2016-11-23 IMAGING — CR DG CHEST 2V
2 series · 2 of 2 positions shown · non-contrast
Comparison: 10/29/2013

CLINICAL DATA: Fell down a flight of stairs 4 days ago striking
back of head on concrete, got up and fell forward hitting head on
Sorin Oxendine, with chest pain and shortness of breath, LEFT
anterolateral rib pain, history COPD, smoking, asthma

EXAM:
CHEST  2 VIEW

[view not recorded (1 of 2)]
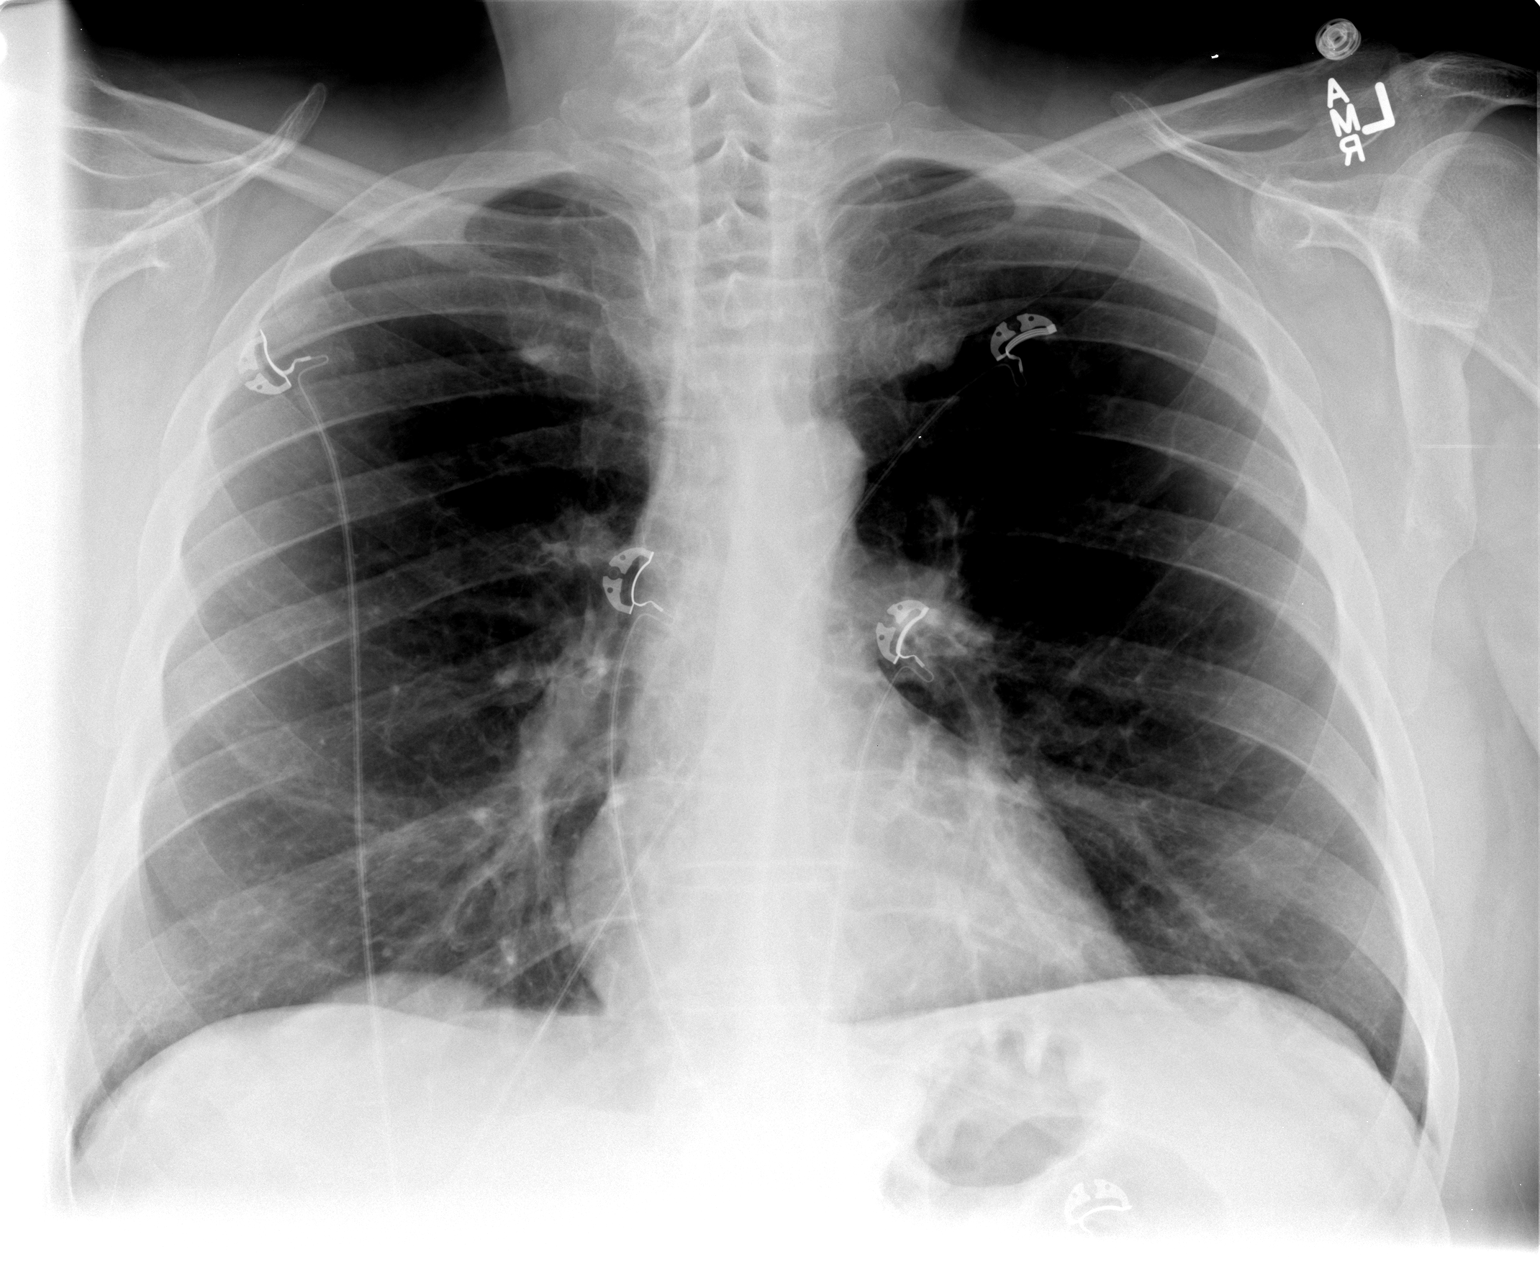

[view not recorded (2 of 2)]
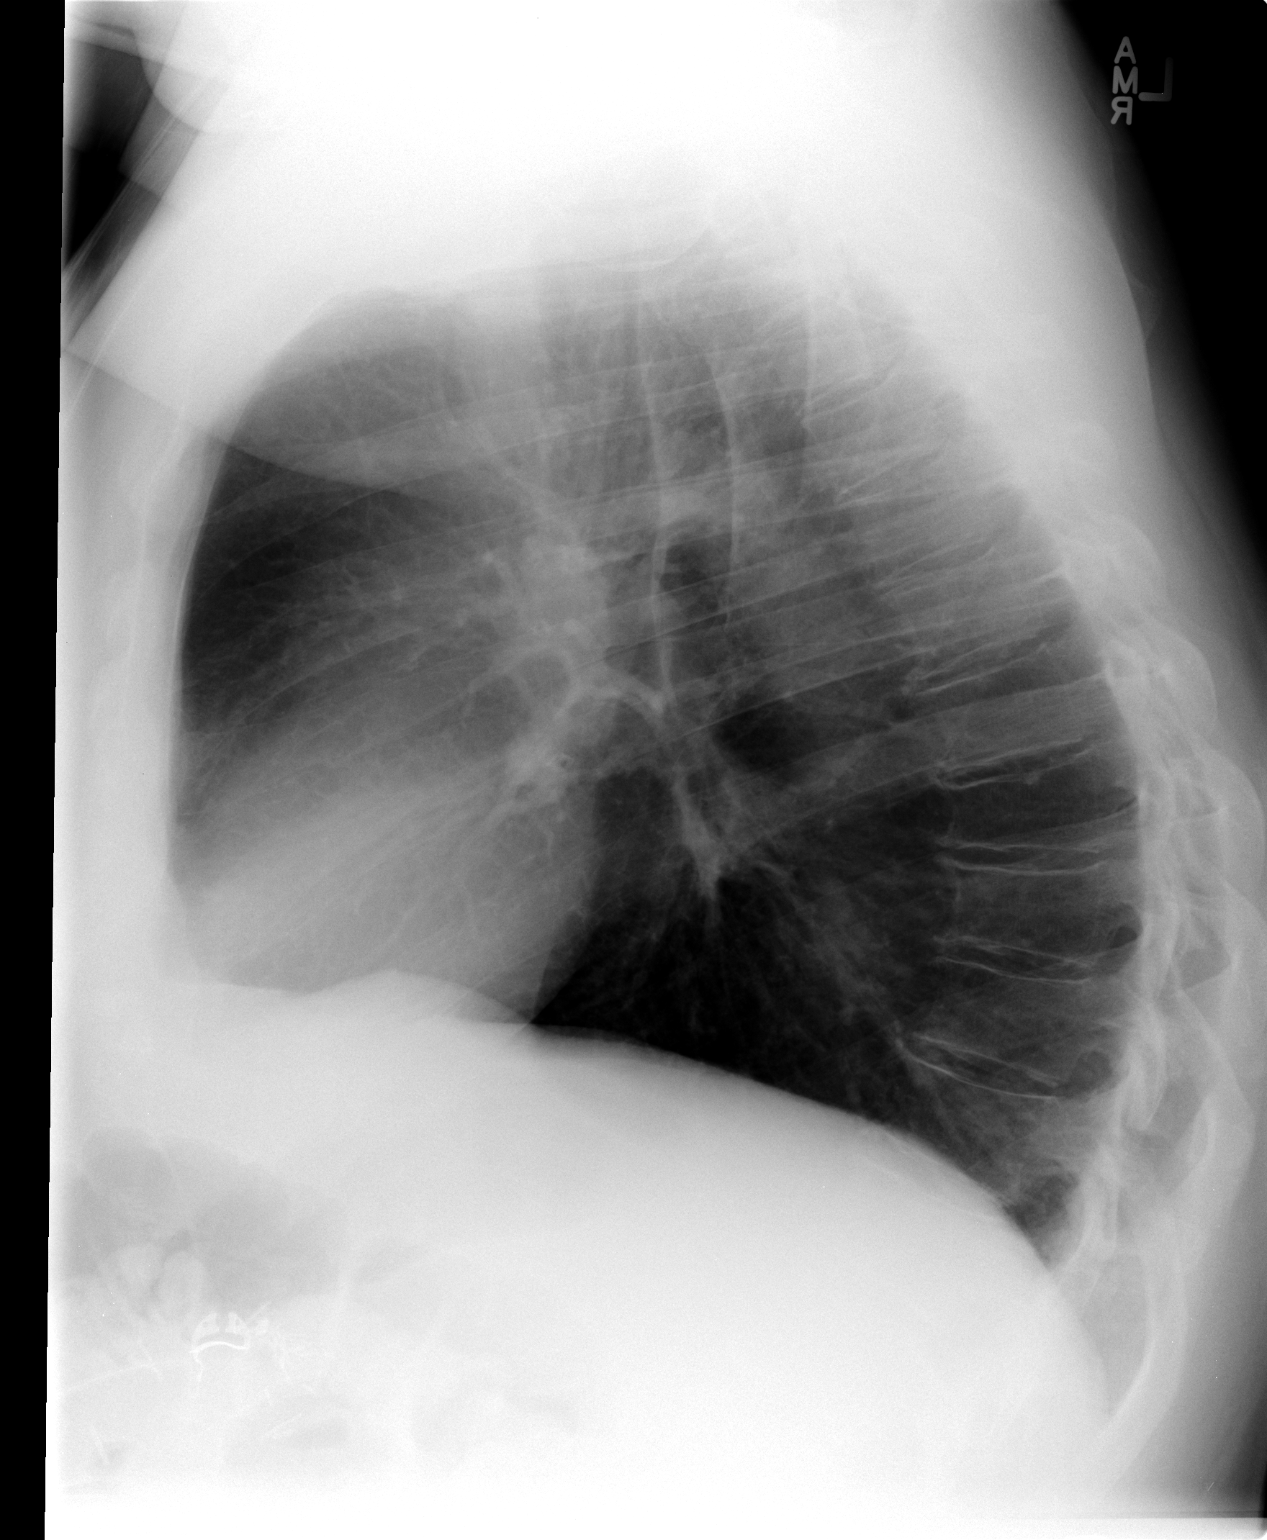

[2 of 2 positions shown; findings below may reference images not displayed]

FINDINGS: Normal heart size, mediastinal contours, and pulmonary vascularity.

Lungs appear mildly hyperlucent but clear.

No infiltrate, pleural effusion or pneumothorax.

No definite fractures identified.
IMPRESSION: No acute abnormalities.
